# Patient Record
Sex: Female | Born: 1950 | Race: Black or African American | Hispanic: No | Marital: Single | Smoking: Former smoker
Health system: Southern US, Community
[De-identification: ages and names within clinical notes are randomized; demographics above are authoritative.]

## PROBLEM LIST (undated history)

## (undated) DIAGNOSIS — K0889 Other specified disorders of teeth and supporting structures: Secondary | ICD-10-CM

## (undated) DIAGNOSIS — E7800 Pure hypercholesterolemia, unspecified: Secondary | ICD-10-CM

## (undated) DIAGNOSIS — R7303 Prediabetes: Secondary | ICD-10-CM

## (undated) DIAGNOSIS — D649 Anemia, unspecified: Secondary | ICD-10-CM

## (undated) DIAGNOSIS — R222 Localized swelling, mass and lump, trunk: Secondary | ICD-10-CM

## (undated) DIAGNOSIS — M109 Gout, unspecified: Secondary | ICD-10-CM

## (undated) DIAGNOSIS — I10 Essential (primary) hypertension: Secondary | ICD-10-CM

## (undated) DIAGNOSIS — Z972 Presence of dental prosthetic device (complete) (partial): Secondary | ICD-10-CM

## (undated) HISTORY — DX: Prediabetes: R73.03

## (undated) HISTORY — PX: COLONOSCOPY: SHX174

## (undated) HISTORY — DX: Essential (primary) hypertension: I10

## (undated) HISTORY — DX: Localized swelling, mass and lump, trunk: R22.2

## (undated) HISTORY — PX: BREAST BIOPSY: SHX20

## (undated) HISTORY — PX: TUBAL LIGATION: SHX77

---

## 2004-02-19 ENCOUNTER — Ambulatory Visit: Payer: Self-pay | Admitting: Unknown Physician Specialty

## 2004-02-23 ENCOUNTER — Emergency Department: Payer: Self-pay | Admitting: Emergency Medicine

## 2006-01-18 ENCOUNTER — Ambulatory Visit: Payer: Self-pay | Admitting: Unknown Physician Specialty

## 2006-03-28 ENCOUNTER — Ambulatory Visit: Payer: Self-pay | Admitting: Gastroenterology

## 2015-04-14 ENCOUNTER — Other Ambulatory Visit: Payer: Self-pay | Admitting: Family Medicine

## 2015-04-14 DIAGNOSIS — Z1231 Encounter for screening mammogram for malignant neoplasm of breast: Secondary | ICD-10-CM

## 2016-08-25 ENCOUNTER — Other Ambulatory Visit: Payer: Self-pay | Admitting: Family Medicine

## 2016-08-25 DIAGNOSIS — Z1239 Encounter for other screening for malignant neoplasm of breast: Secondary | ICD-10-CM

## 2017-07-31 ENCOUNTER — Other Ambulatory Visit: Payer: Self-pay | Admitting: Family Medicine

## 2017-07-31 DIAGNOSIS — Z13820 Encounter for screening for osteoporosis: Secondary | ICD-10-CM

## 2017-07-31 DIAGNOSIS — Z1231 Encounter for screening mammogram for malignant neoplasm of breast: Secondary | ICD-10-CM

## 2017-09-12 ENCOUNTER — Ambulatory Visit
Admission: RE | Admit: 2017-09-12 | Discharge: 2017-09-12 | Disposition: A | Discharge status: Home / Self Care | Payer: Medicare Other | Source: Ambulatory Visit | Attending: Family Medicine | Admitting: Family Medicine

## 2017-09-12 ENCOUNTER — Encounter (INDEPENDENT_AMBULATORY_CARE_PROVIDER_SITE_OTHER): Payer: Self-pay

## 2017-09-12 DIAGNOSIS — R928 Other abnormal and inconclusive findings on diagnostic imaging of breast: Secondary | ICD-10-CM | POA: Diagnosis not present

## 2017-09-12 DIAGNOSIS — Z1231 Encounter for screening mammogram for malignant neoplasm of breast: Secondary | ICD-10-CM | POA: Diagnosis present

## 2017-09-22 ENCOUNTER — Other Ambulatory Visit: Payer: Self-pay | Admitting: Family Medicine

## 2017-09-22 DIAGNOSIS — R928 Other abnormal and inconclusive findings on diagnostic imaging of breast: Secondary | ICD-10-CM

## 2017-10-03 ENCOUNTER — Ambulatory Visit
Admission: RE | Admit: 2017-10-03 | Discharge: 2017-10-03 | Disposition: A | Discharge status: Home / Self Care | Payer: Medicare Other | Source: Ambulatory Visit | Attending: Family Medicine | Admitting: Family Medicine

## 2017-10-03 DIAGNOSIS — R928 Other abnormal and inconclusive findings on diagnostic imaging of breast: Secondary | ICD-10-CM

## 2017-10-10 ENCOUNTER — Other Ambulatory Visit: Payer: Self-pay | Admitting: Family Medicine

## 2017-10-10 DIAGNOSIS — N6310 Unspecified lump in the right breast, unspecified quadrant: Secondary | ICD-10-CM

## 2017-10-10 DIAGNOSIS — R928 Other abnormal and inconclusive findings on diagnostic imaging of breast: Secondary | ICD-10-CM

## 2017-10-16 ENCOUNTER — Ambulatory Visit
Admission: RE | Admit: 2017-10-16 | Discharge: 2017-10-16 | Disposition: A | Discharge status: Home / Self Care | Payer: Medicare Other | Source: Ambulatory Visit | Attending: Family Medicine | Admitting: Family Medicine

## 2017-10-16 DIAGNOSIS — R928 Other abnormal and inconclusive findings on diagnostic imaging of breast: Secondary | ICD-10-CM | POA: Diagnosis not present

## 2017-10-16 DIAGNOSIS — N6310 Unspecified lump in the right breast, unspecified quadrant: Secondary | ICD-10-CM | POA: Insufficient documentation

## 2017-10-16 HISTORY — PX: BREAST BIOPSY: SHX20

## 2017-10-17 LAB — SURGICAL PATHOLOGY

## 2017-11-07 ENCOUNTER — Encounter: Payer: Self-pay | Admitting: Surgery

## 2017-11-07 ENCOUNTER — Ambulatory Visit (INDEPENDENT_AMBULATORY_CARE_PROVIDER_SITE_OTHER): Payer: Medicare Other | Admitting: Surgery

## 2017-11-07 VITALS — BP 164/93 | HR 86 | Temp 98.3°F | Ht 64.0 in | Wt 217.0 lb

## 2017-11-07 DIAGNOSIS — R928 Other abnormal and inconclusive findings on diagnostic imaging of breast: Secondary | ICD-10-CM | POA: Diagnosis not present

## 2017-11-07 NOTE — Patient Instructions (Signed)
[facility] will be contacting you to schedule your MRI. They will also give you directions on how to get there. Please go and have your MRI done and then we will see you back to discuss results.  Please give [place] a call in case you have any questions or concerns.

## 2017-11-07 NOTE — Progress Notes (Signed)
Alicia Bridges is an 67 y.o. female.   Chief Complaint: Right breast intraductal papilloma Consult requested by cancer Center MD HPI: This a patient had a routine mammogram.  She did not feel a mass.  She does not do regular self exams.  This was found on a routine mammogram screening but she has not had a mammogram every year. 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 No family history of breast cancer. Past Surgical History:  Procedure Laterality Date  . BREAST BIOPSY Right 10/16/2017   path pending    Family History  Problem Relation Age of Onset  . Breast cancer Neg Hx    Social History:  has no tobacco, alcohol, and drug history on file.  Allergies: Allergies not on file   (Not in a hospital admission)   Review of Systems:   Review of Systems  Constitutional: Negative.   HENT: Negative.   Eyes: Negative.   Respiratory: Negative.   Cardiovascular: Negative.   Gastrointestinal: Negative.   Genitourinary: Negative.   Musculoskeletal: Negative.   Skin: Negative.   Neurological: Negative.   Endo/Heme/Allergies: Negative.   Psychiatric/Behavioral: Negative.     Physical Exam:  Physical Exam  Constitutional: She is oriented to person, place, and time. She appears well-developed and well-nourished. No distress.  HENT:  Head: Normocephalic and atraumatic.  Eyes: Pupils are equal, round, and reactive to light. EOM are normal. Right eye exhibits no discharge. Left eye exhibits no discharge. No scleral icterus.  Neck: Normal range of motion. Neck supple.  Cardiovascular: Normal rate and regular rhythm.  Pulmonary/Chest: Effort normal and breath sounds normal. No respiratory distress.  Abdominal: Soft. She exhibits no distension.  Musculoskeletal: Normal range of motion. She exhibits no edema or deformity.  Neurological: She is alert and oriented  to person, place, and time.  Skin: Skin is warm and dry. She is not diaphoretic. No erythema. No pallor.  Vitals reviewed. Breast exam: No palpable mass in either breast no axillary adenopathy biopsy site on the right breast is healing well with minimal ecchymosis in the upper inner quadrant.  No subareolar mass palpable  There were no vitals taken for this visit.    No results found for this or any previous visit (from the past 48 hour(s)). No results found.   Assessment/Plan Mammogram and ultrasound are personally reviewed.  Lip is in place.  I agree with the need for an MRI followed by excision.  We will order the MRI and see her back afterwards and discuss excisional biopsy of this likely benign intraductal papilloma.  Florene Glen, MD, FACS

## 2017-11-17 ENCOUNTER — Ambulatory Visit
Admission: RE | Admit: 2017-11-17 | Discharge: 2017-11-17 | Disposition: A | Discharge status: Home / Self Care | Payer: Medicare Other | Source: Ambulatory Visit | Attending: Surgery | Admitting: Surgery

## 2017-11-17 DIAGNOSIS — R928 Other abnormal and inconclusive findings on diagnostic imaging of breast: Secondary | ICD-10-CM

## 2017-11-17 MED ORDER — GADOBENATE DIMEGLUMINE 529 MG/ML IV SOLN
20.0000 mL | Freq: Once | INTRAVENOUS | Status: AC | PRN
  Administered 2017-11-17: 20 mL via INTRAVENOUS

## 2017-11-29 ENCOUNTER — Other Ambulatory Visit: Payer: Self-pay

## 2017-12-06 ENCOUNTER — Other Ambulatory Visit: Payer: Self-pay

## 2017-12-06 ENCOUNTER — Telehealth: Payer: Self-pay

## 2017-12-06 ENCOUNTER — Other Ambulatory Visit: Payer: Self-pay | Admitting: Surgery

## 2017-12-06 DIAGNOSIS — R928 Other abnormal and inconclusive findings on diagnostic imaging of breast: Secondary | ICD-10-CM

## 2017-12-06 NOTE — Telephone Encounter (Signed)
Called patient to let her know that Dr. Burt Knack requested for her to have a MRI Guided Biopsy. Therefore, this was ordered for patient and the information was left on her home and cell phone, plus mailed.

## 2017-12-13 ENCOUNTER — Ambulatory Visit
Admission: RE | Admit: 2017-12-13 | Discharge: 2017-12-13 | Disposition: A | Discharge status: Home / Self Care | Payer: Medicare Other | Source: Ambulatory Visit | Attending: Surgery | Admitting: Surgery

## 2017-12-13 ENCOUNTER — Other Ambulatory Visit: Payer: Self-pay | Admitting: Surgery

## 2017-12-13 DIAGNOSIS — R928 Other abnormal and inconclusive findings on diagnostic imaging of breast: Secondary | ICD-10-CM

## 2017-12-13 MED ORDER — GADOBENATE DIMEGLUMINE 529 MG/ML IV SOLN
20.0000 mL | Freq: Once | INTRAVENOUS | Status: AC | PRN
  Administered 2017-12-13: 20 mL via INTRAVENOUS

## 2017-12-21 ENCOUNTER — Encounter: Payer: Self-pay | Admitting: Surgery

## 2017-12-21 ENCOUNTER — Ambulatory Visit (INDEPENDENT_AMBULATORY_CARE_PROVIDER_SITE_OTHER): Payer: Medicare Other | Admitting: Surgery

## 2017-12-21 VITALS — BP 161/95 | HR 75 | Temp 98.2°F | Ht 64.0 in | Wt 211.0 lb

## 2017-12-21 DIAGNOSIS — R928 Other abnormal and inconclusive findings on diagnostic imaging of breast: Secondary | ICD-10-CM | POA: Diagnosis not present

## 2017-12-21 NOTE — Progress Notes (Signed)
Outpatient Surgical Follow Up  12/21/2017  Alicia Bridges is an 67 y.o. female.   CC: Intraductal papilloma of the right breast  HPI: This patient with an abnormal mammogram leading to an intraductal papilloma finding on core needle biopsy of the right breast.  She also had a recent MRI with biopsies which were benign.  No complaints at this time.  Not noticing any new masses  Past Medical History:  Diagnosis Date  . Hypertension   . Pre-diabetes     Past Surgical History:  Procedure Laterality Date  . BREAST BIOPSY Right 10/16/2017   path pending  . TUBAL LIGATION      Family History  Problem Relation Age of Onset  . Heart failure Mother   . Stroke Father   . Breast cancer Neg Hx     Social History:  reports that she quit smoking about 19 years ago. She has a 3.00 pack-year smoking history. She has never used smokeless tobacco. She reports that she does not drink alcohol or use drugs.  Allergies: No Known Allergies  Medications reviewed.   Review of Systems:   Review of Systems  Constitutional: Negative.   HENT: Negative.   Eyes: Negative.   Respiratory: Negative.   Cardiovascular: Negative.   Gastrointestinal: Negative.   Genitourinary: Negative.   Musculoskeletal: Negative.   Skin: Negative.   Neurological: Negative.   Endo/Heme/Allergies: Negative.   Psychiatric/Behavioral: Negative.      Physical Exam:  There were no vitals taken for this visit.  Physical Exam  Constitutional: She is oriented to person, place, and time. She appears well-developed and well-nourished.  HENT:  Head: Normocephalic and atraumatic.  Eyes: Pupils are equal, round, and reactive to light. EOM are normal.  Neck: Normal range of motion. Neck supple.  Cardiovascular: Normal rate and regular rhythm.  Pulmonary/Chest: Effort normal and breath sounds normal. No respiratory distress.  Musculoskeletal: Normal range of motion. She exhibits no edema or deformity.   Neurological: She is alert and oriented to person, place, and time.  Skin: Skin is warm and dry.  Vitals reviewed.  Breast exam demonstrates a Band-Aid in the right lateral quadrant no mass in either breast.   No results found for this or any previous visit (from the past 48 hour(s)). No results found.  Assessment/Plan:  Pathology and mammograms are reviewed.  As is the MRI.  Recommend a right-sided needle localization breast biopsy for the original intraductal papilloma.  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  The rationale for this was discussed the options of observation with serial mammograms were reviewed with her.  She is retired and wants to get a new job and wants to put this behind her.  The risks of bleeding infection cosmetic deformity seroma and open wound were reviewed with her as well as the risk of missed lesion she understood and agreed to proceed  Florene Glen, MD, FACS

## 2017-12-21 NOTE — Patient Instructions (Addendum)
We will call you with your surgery date.   Please see the blue pre-care sheet for surgery sheet.  Please call office if you have questions or concerns.

## 2017-12-25 ENCOUNTER — Other Ambulatory Visit: Payer: Self-pay | Admitting: Surgery

## 2017-12-25 ENCOUNTER — Telehealth: Payer: Self-pay | Admitting: Surgery

## 2017-12-25 DIAGNOSIS — D241 Benign neoplasm of right breast: Secondary | ICD-10-CM

## 2017-12-25 NOTE — Telephone Encounter (Signed)
Pt advised of pre op date/time and sx date. Sx: 12/27/17 with Dr Charm Barges breast biopsy with NL.  Pre op: 12/26/17 between 9-1:00pm--phone interview.   Patient made aware to arrive at [facility] at 8:20am the day of surgery. Patient understands all directions.

## 2017-12-26 ENCOUNTER — Other Ambulatory Visit: Payer: Self-pay

## 2017-12-26 ENCOUNTER — Encounter
Admission: RE | Admit: 2017-12-26 | Discharge: 2017-12-26 | Disposition: A | Discharge status: Home / Self Care | Payer: Medicare Other | Source: Ambulatory Visit | Attending: Surgery | Admitting: Surgery

## 2017-12-26 DIAGNOSIS — D241 Benign neoplasm of right breast: Secondary | ICD-10-CM | POA: Diagnosis not present

## 2017-12-26 DIAGNOSIS — Z87891 Personal history of nicotine dependence: Secondary | ICD-10-CM | POA: Diagnosis not present

## 2017-12-26 DIAGNOSIS — I10 Essential (primary) hypertension: Secondary | ICD-10-CM | POA: Diagnosis not present

## 2017-12-26 DIAGNOSIS — Z79899 Other long term (current) drug therapy: Secondary | ICD-10-CM | POA: Diagnosis not present

## 2017-12-26 DIAGNOSIS — R7303 Prediabetes: Secondary | ICD-10-CM | POA: Diagnosis not present

## 2017-12-26 HISTORY — DX: Anemia, unspecified: D64.9

## 2017-12-26 LAB — BASIC METABOLIC PANEL
Anion gap: 9 (ref 5–15)
BUN: 18 mg/dL (ref 8–23)
CHLORIDE: 110 mmol/L (ref 98–111)
CO2: 24 mmol/L (ref 22–32)
Calcium: 9.3 mg/dL (ref 8.9–10.3)
Creatinine, Ser: 1.16 mg/dL — ABNORMAL HIGH (ref 0.44–1.00)
GFR calc Af Amer: 54 mL/min — ABNORMAL LOW (ref >60)
GFR calc non Af Amer: 47 mL/min — ABNORMAL LOW (ref >60)
GLUCOSE: 110 mg/dL — AB (ref 70–99)
Potassium: 3.9 mmol/L (ref 3.5–5.1)
Sodium: 143 mmol/L (ref 135–145)

## 2017-12-26 LAB — CBC WITH DIFFERENTIAL/PLATELET
Basophils Absolute: 0 10*3/uL (ref 0–0.1)
Basophils Relative: 0 %
Eosinophils Absolute: 0 10*3/uL (ref 0–0.7)
Eosinophils Relative: 1 %
HCT: 32.7 % — ABNORMAL LOW (ref 35.0–47.0)
Hemoglobin: 10.8 g/dL — ABNORMAL LOW (ref 12.0–16.0)
LYMPHS ABS: 1.9 10*3/uL (ref 1.0–3.6)
LYMPHS PCT: 39 %
MCH: 29.9 pg (ref 26.0–34.0)
MCHC: 33.2 g/dL (ref 32.0–36.0)
MCV: 90.1 fL (ref 80.0–100.0)
MONO ABS: 0.4 10*3/uL (ref 0.2–0.9)
Monocytes Relative: 8 %
Neutro Abs: 2.6 10*3/uL (ref 1.4–6.5)
Neutrophils Relative %: 52 %
PLATELETS: 237 10*3/uL (ref 150–440)
RBC: 3.63 MIL/uL — AB (ref 3.80–5.20)
RDW: 15.3 % — ABNORMAL HIGH (ref 11.5–14.5)
WBC: 4.9 10*3/uL (ref 3.6–11.0)

## 2017-12-26 NOTE — Patient Instructions (Signed)
Your procedure is scheduled on: 12-27-17 [facility] Report to [facility] @ 8:20 AM  Remember: Instructions that are not followed completely may result in serious medical risk, up to and including death, or upon the discretion of your surgeon and anesthesiologist your surgery may need to be rescheduled.    _x___ 1. Do not eat food after midnight the night before your procedure. You may drink clear liquids up to 2 hours before you are scheduled to arrive at the hospital for your procedure.  Do not drink clear liquids within 2 hours of your scheduled arrival to the hospital.  Clear liquids include  --Water or Apple juice without pulp  --Clear carbohydrate beverage such as ClearFast or Gatorade  --Black Coffee or Clear Tea (No milk, no creamers, do not add anything to the coffee or Tea   ____Ensure clear carbohydrate drink on the way to the hospital for bariatric patients  ____Ensure clear carbohydrate drink 3 hours before surgery for Dr Dwyane Luo patients if physician instructed.     __x__ 2. No Alcohol for 24 hours before or after surgery.   __x__3. No Smoking or e-cigarettes for 24 prior to surgery.  Do not use any chewable tobacco products for at least 6 hour prior to surgery   ____  4. Bring all medications with you on the day of surgery if instructed.    __x__ 5. Notify your doctor if there is any change in your medical condition     (cold, fever, infections).    x___6. On the morning of surgery brush your teeth with toothpaste and water.  You may rinse your mouth with mouth wash if you wish.  Do not swallow any toothpaste or mouthwash.   Do not wear jewelry, make-up, hairpins, clips or nail polish.  Do not wear lotions, powders, or perfumes. You may wear deodorant.  Do not shave 48 hours prior to surgery. Men may shave face and neck.  Do not bring valuables to the hospital.    [facility] is not responsible for any belongings or valuables.               Contacts,  dentures or bridgework may not be worn into surgery.  Leave your suitcase in the car. After surgery it may be brought to your room.  For patients admitted to the hospital, discharge time is determined by your treatment team.  _  Patients discharged the day of surgery will not be allowed to drive home.  You will need someone to drive you home and stay with you the night of your procedure.    Please read over the following fact sheets that you were given:   [facility] Preparing for Surgery  ____ Take anti-hypertensive listed below, cardiac, seizure, asthma, anti-reflux and psychiatric medicines. These include:  1. NONE  2.  3.  4.  5.  6.  ____Fleets enema or Magnesium Citrate as directed.   _x___ Use CHG Soap or sage wipes as directed on instruction sheet   ____ Use inhalers on the day of surgery and bring to hospital day of surgery  ____ Stop Metformin and Janumet 2 days prior to surgery.    ____ Take 1/2 of usual insulin dose the night before surgery and none on the morning surgery.   ____ Follow recommendations from Cardiologist, Pulmonologist or PCP regarding stopping Aspirin, Coumadin, Plavix ,Eliquis, Effient, or Pradaxa, and Pletal.  X____Stop Anti-inflammatories such as Advil, Aleve, Ibuprofen, Motrin, Naproxen, Naprosyn, Goodies powders or aspirin products. OK  to take Tylenol    ____ Stop supplements until after surgery.    ____ Bring C-Pap to the hospital.

## 2017-12-27 ENCOUNTER — Ambulatory Visit: Payer: Medicare Other | Admitting: Anesthesiology

## 2017-12-27 ENCOUNTER — Ambulatory Visit
Admission: RE | Admit: 2017-12-27 | Discharge: 2017-12-27 | Disposition: A | Discharge status: Home / Self Care | Payer: Medicare Other | Source: Ambulatory Visit | Attending: Surgery | Admitting: Surgery

## 2017-12-27 ENCOUNTER — Other Ambulatory Visit: Payer: Self-pay

## 2017-12-27 ENCOUNTER — Encounter
Admission: RE | Disposition: A | Discharge status: Home / Self Care | Payer: Self-pay | Source: Ambulatory Visit | Attending: Surgery

## 2017-12-27 ENCOUNTER — Encounter: Payer: Self-pay | Admitting: *Deleted

## 2017-12-27 DIAGNOSIS — I10 Essential (primary) hypertension: Secondary | ICD-10-CM | POA: Insufficient documentation

## 2017-12-27 DIAGNOSIS — Z87891 Personal history of nicotine dependence: Secondary | ICD-10-CM | POA: Insufficient documentation

## 2017-12-27 DIAGNOSIS — R928 Other abnormal and inconclusive findings on diagnostic imaging of breast: Secondary | ICD-10-CM | POA: Diagnosis not present

## 2017-12-27 DIAGNOSIS — D241 Benign neoplasm of right breast: Secondary | ICD-10-CM | POA: Diagnosis not present

## 2017-12-27 DIAGNOSIS — Z79899 Other long term (current) drug therapy: Secondary | ICD-10-CM | POA: Insufficient documentation

## 2017-12-27 DIAGNOSIS — R7303 Prediabetes: Secondary | ICD-10-CM | POA: Insufficient documentation

## 2017-12-27 HISTORY — PX: BREAST BIOPSY: SHX20

## 2017-12-27 HISTORY — PX: BREAST EXCISIONAL BIOPSY: SUR124

## 2017-12-27 SURGERY — BREAST BIOPSY WITH NEEDLE LOCALIZATION
Anesthesia: General | Laterality: Right | Wound class: Clean

## 2017-12-27 MED ORDER — FENTANYL CITRATE (PF) 100 MCG/2ML IJ SOLN
INTRAMUSCULAR | Status: AC
  Filled 2017-12-27: qty 2

## 2017-12-27 MED ORDER — FENTANYL CITRATE (PF) 100 MCG/2ML IJ SOLN
INTRAMUSCULAR | Status: DC | PRN
  Administered 2017-12-27 (×2): 50 ug via INTRAVENOUS
  Administered 2017-12-27 (×2): 25 ug via INTRAVENOUS

## 2017-12-27 MED ORDER — MEPERIDINE HCL 50 MG/ML IJ SOLN: 6.2500 mg | INTRAMUSCULAR | Status: DC | PRN

## 2017-12-27 MED ORDER — OXYCODONE HCL 5 MG PO TABS
ORAL_TABLET | ORAL | Status: AC
  Administered 2017-12-27: 5 mg
  Filled 2017-12-27: qty 1

## 2017-12-27 MED ORDER — LACTATED RINGERS IV SOLN
INTRAVENOUS | Status: DC
  Administered 2017-12-27: 10:00:00 via INTRAVENOUS

## 2017-12-27 MED ORDER — BUPIVACAINE-EPINEPHRINE (PF) 0.25% -1:200000 IJ SOLN
INTRAMUSCULAR | Status: AC
  Filled 2017-12-27: qty 30

## 2017-12-27 MED ORDER — BUPIVACAINE-EPINEPHRINE 0.5% -1:200000 IJ SOLN
INTRAMUSCULAR | Status: DC | PRN
  Administered 2017-12-27: 30 mL

## 2017-12-27 MED ORDER — FENTANYL CITRATE (PF) 100 MCG/2ML IJ SOLN
25.0000 ug | INTRAMUSCULAR | Status: DC | PRN
Start: 2017-12-27 — End: 2017-12-27
  Administered 2017-12-27 (×2): 50 ug via INTRAVENOUS

## 2017-12-27 MED ORDER — FAMOTIDINE 20 MG PO TABS
20.0000 mg | ORAL_TABLET | Freq: Once | ORAL | Status: AC
  Administered 2017-12-27: 20 mg via ORAL

## 2017-12-27 MED ORDER — ACETAMINOPHEN 10 MG/ML IV SOLN
INTRAVENOUS | Status: DC | PRN
  Administered 2017-12-27: 1000 mg via INTRAVENOUS

## 2017-12-27 MED ORDER — HEPARIN SODIUM (PORCINE) 5000 UNIT/ML IJ SOLN
INTRAMUSCULAR | Status: AC
  Administered 2017-12-27: 5000 [IU] via SUBCUTANEOUS
  Filled 2017-12-27: qty 1

## 2017-12-27 MED ORDER — PROMETHAZINE HCL 25 MG/ML IJ SOLN: 6.2500 mg | INTRAMUSCULAR | Status: DC | PRN

## 2017-12-27 MED ORDER — PHENYLEPHRINE HCL 10 MG/ML IJ SOLN
INTRAMUSCULAR | Status: DC | PRN
  Administered 2017-12-27: 50 ug via INTRAVENOUS

## 2017-12-27 MED ORDER — PROPOFOL 10 MG/ML IV BOLUS
INTRAVENOUS | Status: DC | PRN
Start: 2017-12-27 — End: 2017-12-27
  Administered 2017-12-27: 160 mg via INTRAVENOUS

## 2017-12-27 MED ORDER — CHLORHEXIDINE GLUCONATE CLOTH 2 % EX PADS: 6.0000 | MEDICATED_PAD | Freq: Once | CUTANEOUS | Status: DC

## 2017-12-27 MED ORDER — OXYCODONE HCL 5 MG PO TABS: 5.0000 mg | ORAL_TABLET | Freq: Once | ORAL | Status: DC | PRN

## 2017-12-27 MED ORDER — DEXAMETHASONE SODIUM PHOSPHATE 10 MG/ML IJ SOLN
INTRAMUSCULAR | Status: DC | PRN
  Administered 2017-12-27: 8 mg via INTRAVENOUS

## 2017-12-27 MED ORDER — HYDROCODONE-ACETAMINOPHEN 5-300 MG PO TABS: 1.0000 | ORAL_TABLET | ORAL | 0 refills | Status: DC | PRN

## 2017-12-27 MED ORDER — FAMOTIDINE 20 MG PO TABS
ORAL_TABLET | ORAL | Status: AC
  Filled 2017-12-27: qty 1

## 2017-12-27 MED ORDER — ONDANSETRON HCL 4 MG/2ML IJ SOLN
INTRAMUSCULAR | Status: AC
  Filled 2017-12-27: qty 2

## 2017-12-27 MED ORDER — PROPOFOL 10 MG/ML IV BOLUS
INTRAVENOUS | Status: AC
  Filled 2017-12-27: qty 20

## 2017-12-27 MED ORDER — LIDOCAINE HCL (CARDIAC) PF 100 MG/5ML IV SOSY
PREFILLED_SYRINGE | INTRAVENOUS | Status: DC | PRN
  Administered 2017-12-27: 100 mg via INTRAVENOUS

## 2017-12-27 MED ORDER — OXYCODONE HCL 5 MG/5ML PO SOLN: 5.0000 mg | Freq: Once | ORAL | Status: DC | PRN

## 2017-12-27 MED ORDER — ACETAMINOPHEN 10 MG/ML IV SOLN
INTRAVENOUS | Status: AC
  Filled 2017-12-27: qty 100

## 2017-12-27 MED ORDER — METHYLENE BLUE 0.5 % INJ SOLN
INTRAVENOUS | Status: AC
  Filled 2017-12-27: qty 10

## 2017-12-27 MED ORDER — BUPIVACAINE-EPINEPHRINE (PF) 0.5% -1:200000 IJ SOLN
INTRAMUSCULAR | Status: AC
  Filled 2017-12-27: qty 30

## 2017-12-27 MED ORDER — HEPARIN SODIUM (PORCINE) 5000 UNIT/ML IJ SOLN
5000.0000 [IU] | Freq: Once | INTRAMUSCULAR | Status: AC
  Administered 2017-12-27: 5000 [IU] via SUBCUTANEOUS

## 2017-12-27 MED ORDER — LIDOCAINE HCL (PF) 2 % IJ SOLN
INTRAMUSCULAR | Status: AC
  Filled 2017-12-27: qty 10

## 2017-12-27 MED ORDER — ONDANSETRON HCL 4 MG/2ML IJ SOLN
INTRAMUSCULAR | Status: DC | PRN
  Administered 2017-12-27: 4 mg via INTRAVENOUS

## 2017-12-27 SURGICAL SUPPLY — 35 items
BLADE SURG 15 STRL LF DISP TIS (BLADE) ×1 IMPLANT
BLADE SURG 15 STRL SS (BLADE) ×2
CANISTER SUCT 1200ML W/VALVE (MISCELLANEOUS) ×3 IMPLANT
CHLORAPREP W/TINT 26ML (MISCELLANEOUS) ×3 IMPLANT
CNTNR SPEC 2.5X3XGRAD LEK (MISCELLANEOUS) ×2
CONT SPEC 4OZ STER OR WHT (MISCELLANEOUS) ×4
CONTAINER SPEC 2.5X3XGRAD LEK (MISCELLANEOUS) ×2 IMPLANT
COVER PROBE FLX POLY STRL (MISCELLANEOUS) ×3 IMPLANT
DEVICE DUBIN SPECIMEN MAMMOGRA (MISCELLANEOUS) ×3 IMPLANT
DRAPE LAPAROTOMY 100X77 ABD (DRAPES) ×3 IMPLANT
DRSG GAUZE FLUFF 36X18 (GAUZE/BANDAGES/DRESSINGS) ×3 IMPLANT
ELECT CAUTERY BLADE 6.4 (BLADE) ×3 IMPLANT
ELECT REM PT RETURN 9FT ADLT (ELECTROSURGICAL) ×3
ELECTRODE REM PT RTRN 9FT ADLT (ELECTROSURGICAL) ×1 IMPLANT
GLOVE SURG SYN 7.0 (GLOVE) ×3 IMPLANT
GLOVE SURG SYN 7.5  E (GLOVE) ×2
GLOVE SURG SYN 7.5 E (GLOVE) ×1 IMPLANT
GOWN STRL REUS W/ TWL LRG LVL3 (GOWN DISPOSABLE) ×1 IMPLANT
GOWN STRL REUS W/TWL LRG LVL3 (GOWN DISPOSABLE) ×2
KIT TURNOVER KIT A (KITS) ×3 IMPLANT
LABEL OR SOLS (LABEL) ×3 IMPLANT
NEEDLE FILTER BLUNT 18X 1/2SAF (NEEDLE) ×2
NEEDLE FILTER BLUNT 18X1 1/2 (NEEDLE) ×1 IMPLANT
NEEDLE HYPO 22GX1.5 SAFETY (NEEDLE) ×3 IMPLANT
NEEDLE HYPO 25X1 1.5 SAFETY (NEEDLE) ×3 IMPLANT
PACK BASIN MINOR ARMC (MISCELLANEOUS) ×3 IMPLANT
SUT MNCRL 4-0 (SUTURE) ×4
SUT MNCRL 4-0 27XMFL (SUTURE) ×2
SUT SILK 3 0 SH 30 (SUTURE) ×3 IMPLANT
SUT VIC AB 3-0 SH 27 (SUTURE) ×4
SUT VIC AB 3-0 SH 27X BRD (SUTURE) ×2 IMPLANT
SUTURE MNCRL 4-0 27XMF (SUTURE) ×2 IMPLANT
SYR 10ML LL (SYRINGE) ×6 IMPLANT
SYR BULB IRRIG 60ML STRL (SYRINGE) ×3 IMPLANT
WATER STERILE IRR 1000ML POUR (IV SOLUTION) ×3 IMPLANT

## 2017-12-27 NOTE — Op Note (Signed)
12/27/2017  1:16 PM  PATIENT:  Alicia Bridges  67 y.o. female  PRE-OPERATIVE DIAGNOSIS: Right mammographic abnormality  POST-OPERATIVE DIAGNOSIS: Same  PROCEDURE: Right needle localization breast biopsy  SURGEON:  Florene Glen MD, FACS  ANESTHESIA:   General with LMA  Indications: This a patient with prior biopsy showing intraductal papilloma of the right breast in the retroareolar area.  She is also had benign MRI biopsies utilizing core needle technique as well.  She is here for excision of the area in the retroareolar space previously biopsied as fragments of intraductal papilloma.  The rationale for this was discussed the options of observation reviewed and the risks of bleeding infection recurrence cosmetic deformity hematoma seroma were all reviewed with her she understood and agreed to proceed   Details of Procedure: Patient was induced to general anesthesia prepped and draped in a sterile fashion a surgical pause was held.  The previously placed needle localization wire was found in the entry point at the periareolar area.  Local anesthetic was infiltrated in the skin is obtains tissues and then an incision was made in the periareolar area and dissection down to the end of the wire was performed specimen was elevated and sent off for examination.  Radiologist called back and I spoke to her personally.  The needle localization wire was in place and intact and the targeted core needle biopsy marker was in the specimen is well.  Additional Marcaine was placed for a total of 30 cc and the wound was checked multiple times for hemostasis was with electrocautery.  Once assuring that hemostasis was adequate the wound was closed with deep sutures of 3-0 Vicryl followed by 4-0 subcuticular Monocryl Steri-Strips Mastisol and sterile dressings were placed  Patient tolerated the procedure well there were no complications she was taken to recovery room in stable condition to be discharged  care of family follow-up in 10 days.  Florene Glen, MD FACS

## 2017-12-27 NOTE — Anesthesia Procedure Notes (Signed)
Procedure Name: LMA Insertion Performed by: Lance Muss, CRNA Pre-anesthesia Checklist: Patient identified, Patient being monitored, Timeout performed, Emergency Drugs available and Suction available Patient Re-evaluated:Patient Re-evaluated prior to induction Oxygen Delivery Method: Circle system utilized Preoxygenation: Pre-oxygenation with 100% oxygen Induction Type: IV induction Ventilation: Mask ventilation without difficulty LMA: LMA inserted LMA Size: 4.5 Tube type: Oral Number of attempts: 2 Placement Confirmation: positive ETCO2 and breath sounds checked- equal and bilateral Tube secured with: Tape Dental Injury: Teeth and Oropharynx as per pre-operative assessment  Comments: Attempted first with LMA #3.5, not able to get seal. Removed and placed #4.5 LMA. +BBS, +ETCO2.

## 2017-12-27 NOTE — Progress Notes (Signed)
Preoperative Review   Patient is met in the preoperative holding area. The history is reviewed in the chart and with the patient. I personally reviewed the options and rationale as well as the risks of this procedure that have been previously discussed with the patient. All questions asked by the patient and/or family were answered to their satisfaction. Patient marked.  Patient agrees to proceed with this procedure at this time.  Florene Glen M.D. FACS

## 2017-12-27 NOTE — Anesthesia Postprocedure Evaluation (Signed)
Anesthesia Post Note  Patient: Alicia Bridges  Procedure(s) Performed: BREAST BIOPSY WITH NEEDLE LOCALIZATION (Right )  Patient location during evaluation: PACU Anesthesia Type: General Level of consciousness: awake and alert and oriented Pain management: pain level controlled Vital Signs Assessment: post-procedure vital signs reviewed and stable Respiratory status: spontaneous breathing, nonlabored ventilation and respiratory function stable Cardiovascular status: blood pressure returned to baseline and stable Postop Assessment: no signs of nausea or vomiting Anesthetic complications: no     Last Vitals:  Vitals:   12/27/17 1304 12/27/17 1318  BP:  (!) 142/83  Pulse: 81 79  Resp: 17 11  Temp:    SpO2: 100% 97%    Last Pain:  Vitals:   12/27/17 1303  TempSrc:   PainSc: 0-No pain                 Bairon Klemann

## 2017-12-27 NOTE — Transfer of Care (Signed)
Immediate Anesthesia Transfer of Care Note  Patient: Alicia Bridges  Procedure(s) Performed: BREAST BIOPSY WITH NEEDLE LOCALIZATION (Right )  Patient Location: PACU  Anesthesia Type:General  Level of Consciousness: awake and responds to stimulation  Airway & Oxygen Therapy: Patient Spontanous Breathing and Patient connected to face mask oxygen  Post-op Assessment: Report given to RN and Post -op Vital signs reviewed and stable  Post vital signs: Reviewed and stable  Last Vitals:  Vitals Value Taken Time  BP 147/84 12/27/2017  1:03 PM  Temp    Pulse 81 12/27/2017  1:04 PM  Resp 17 12/27/2017  1:04 PM  SpO2 100 % 12/27/2017  1:04 PM  Vitals shown include unvalidated device data.  Last Pain:  Vitals:   12/27/17 0952  TempSrc: Temporal  PainSc: 0-No pain         Complications: No apparent anesthesia complications

## 2017-12-27 NOTE — Discharge Instructions (Signed)
AMBULATORY SURGERY  DISCHARGE INSTRUCTIONS   1) The drugs that you were given will stay in your system until tomorrow so for the next 24 hours you should not:  A) Drive an automobile B) Make any legal decisions C) Drink any alcoholic beverage   2) You may resume regular meals tomorrow.  Today it is better to start with liquids and gradually work up to solid foods.  You may eat anything you prefer, but it is better to start with liquids, then soup and crackers, and gradually work up to solid foods.   3) Please notify your doctor immediately if you have any unusual bleeding, trouble breathing, redness and pain at the surgery site, drainage, fever, or pain not relieved by medication.    4) Additional Instructions:        Please contact your physician with any problems or Same Day Surgery at [phone], Monday through Friday 6 am to 4 pm, or [place] at [facility] number at [phone].Remove dressing in 24 hours. May shower in 24 hours. Leave paper strips in place. Resume all home medications. Follow-up with Dr. Burt Knack in 10 days.

## 2017-12-27 NOTE — Anesthesia Preprocedure Evaluation (Signed)
Anesthesia Evaluation  Patient identified by MRN, date of birth, ID band Patient awake    Reviewed: Allergy & Precautions, NPO status , Patient's Chart, lab work & pertinent test results  History of Anesthesia Complications Negative for: history of anesthetic complications  Airway Mallampati: II  TM Distance: >3 FB Neck ROM: Full    Dental  (+) Missing, Poor Dentition,    Pulmonary neg sleep apnea, neg COPD, former smoker,    breath sounds clear to auscultation- rhonchi (-) wheezing      Cardiovascular hypertension, Pt. on medications (-) CAD, (-) Past MI, (-) Cardiac Stents and (-) CABG  Rhythm:Regular Rate:Normal - Systolic murmurs and - Diastolic murmurs    Neuro/Psych negative neurological ROS  negative psych ROS   GI/Hepatic negative GI ROS, Neg liver ROS,   Endo/Other  Diabetes: prediabetes.  Renal/GU negative Renal ROS     Musculoskeletal negative musculoskeletal ROS (+)   Abdominal (+) + obese,   Peds  Hematology  (+) anemia ,   Anesthesia Other Findings Past Medical History: No date: Anemia No date: Hypertension No date: Pre-diabetes   Reproductive/Obstetrics                             Anesthesia Physical Anesthesia Plan  ASA: II  Anesthesia Plan: General   Post-op Pain Management:    Induction: Intravenous  PONV Risk Score and Plan: 2 and Ondansetron, Dexamethasone and Midazolam  Airway Management Planned: LMA  Additional Equipment:   Intra-op Plan:   Post-operative Plan:   Informed Consent: I have reviewed the patients History and Physical, chart, labs and discussed the procedure including the risks, benefits and alternatives for the proposed anesthesia with the patient or authorized representative who has indicated his/her understanding and acceptance.   Dental advisory given  Plan Discussed with: CRNA and Anesthesiologist  Anesthesia Plan Comments:          Anesthesia Quick Evaluation

## 2017-12-27 NOTE — Anesthesia Post-op Follow-up Note (Signed)
Anesthesia QCDR form completed.        

## 2017-12-28 ENCOUNTER — Encounter: Payer: Self-pay | Admitting: Surgery

## 2017-12-28 LAB — SURGICAL PATHOLOGY

## 2018-01-02 ENCOUNTER — Ambulatory Visit (INDEPENDENT_AMBULATORY_CARE_PROVIDER_SITE_OTHER): Payer: Medicare Other | Admitting: Surgery

## 2018-01-02 ENCOUNTER — Encounter: Payer: Self-pay | Admitting: Surgery

## 2018-01-02 VITALS — BP 162/94 | HR 88 | Temp 97.0°F | Resp 12 | Ht 64.0 in | Wt 206.0 lb

## 2018-01-02 DIAGNOSIS — R928 Other abnormal and inconclusive findings on diagnostic imaging of breast: Secondary | ICD-10-CM

## 2018-01-02 NOTE — Progress Notes (Signed)
Outpatient postop visit  01/02/2018  Alicia Bridges is an 67 y.o. female.    Procedure: Right needle localization breast biopsy  CC: No problems  HPI: Patient status post right needle localization breast biopsy for what was believed to be an later proven by biopsy to be an intraductal papilloma.  The allergy is personally reviewed. Patient has no problems no complaints at this time.  Medications reviewed.    Physical Exam:  Ht 5\' 4"  (1.626 m)   Wt 206 lb (93.4 kg)   BMI 35.36 kg/m     PE: Wound is clean with Steri-Strips in place no erythema no drainage no ecchymosis    Assessment/Plan:  Pathology is reviewed.  Intraductal papilloma present.  Recommend right mammogram in 6 months for follow-up and new baseline.  Discussed pathology with the patient.  Follow-up in 6 months. Florene Glen, MD, FACS

## 2018-01-02 NOTE — Patient Instructions (Addendum)
The patient is aware to call back for any questions or new concerns. Steri strips will gradually fall off The patient has been asked to return to the office in 6 months with a right diagnostic mammogram.

## 2018-02-21 ENCOUNTER — Other Ambulatory Visit: Payer: Self-pay

## 2018-02-21 ENCOUNTER — Encounter: Payer: Self-pay | Admitting: Family Medicine

## 2018-02-21 DIAGNOSIS — Z1211 Encounter for screening for malignant neoplasm of colon: Secondary | ICD-10-CM

## 2018-06-14 ENCOUNTER — Encounter: Payer: Self-pay | Admitting: *Deleted

## 2018-06-14 ENCOUNTER — Encounter: Payer: Self-pay | Admitting: Anesthesiology

## 2018-06-14 ENCOUNTER — Other Ambulatory Visit: Payer: Self-pay

## 2018-06-19 ENCOUNTER — Telehealth: Payer: Self-pay | Admitting: Gastroenterology

## 2018-06-19 NOTE — Discharge Instructions (Signed)
General Anesthesia, Adult, Care After  This sheet gives you information about how to care for yourself after your procedure. Your health care provider may also give you more specific instructions. If you have problems or questions, contact your health care provider.  What can I expect after the procedure?  After the procedure, the following side effects are common:  Pain or discomfort at the IV site.  Nausea.  Vomiting.  Sore throat.  Trouble concentrating.  Feeling cold or chills.  Weak or tired.  Sleepiness and fatigue.  Soreness and body aches. These side effects can affect parts of the body that were not involved in surgery.  Follow these instructions at home:    For at least 24 hours after the procedure:  Have a responsible adult stay with you. It is important to have someone help care for you until you are awake and alert.  Rest as needed.  Do not:  Participate in activities in which you could fall or become injured.  Drive.  Use heavy machinery.  Drink alcohol.  Take sleeping pills or medicines that cause drowsiness.  Make important decisions or sign legal documents.  Take care of children on your own.  Eating and drinking  Follow any instructions from your health care provider about eating or drinking restrictions.  When you feel hungry, start by eating small amounts of foods that are soft and easy to digest (bland), such as toast. Gradually return to your regular diet.  Drink enough fluid to keep your urine pale yellow.  If you vomit, rehydrate by drinking water, juice, or clear broth.  General instructions  If you have sleep apnea, surgery and certain medicines can increase your risk for breathing problems. Follow instructions from your health care provider about wearing your sleep device:  Anytime you are sleeping, including during daytime naps.  While taking prescription pain medicines, sleeping medicines, or medicines that make you drowsy.  Return to your normal activities as told by your health care  provider. Ask your health care provider what activities are safe for you.  Take over-the-counter and prescription medicines only as told by your health care provider.  If you smoke, do not smoke without supervision.  Keep all follow-up visits as told by your health care provider. This is important.  Contact a health care provider if:  You have nausea or vomiting that does not get better with medicine.  You cannot eat or drink without vomiting.  You have pain that does not get better with medicine.  You are unable to pass urine.  You develop a skin rash.  You have a fever.  You have redness around your IV site that gets worse.  Get help right away if:  You have difficulty breathing.  You have chest pain.  You have blood in your urine or stool, or you vomit blood.  Summary  After the procedure, it is common to have a sore throat or nausea. It is also common to feel tired.  Have a responsible adult stay with you for the first 24 hours after general anesthesia. It is important to have someone help care for you until you are awake and alert.  When you feel hungry, start by eating small amounts of foods that are soft and easy to digest (bland), such as toast. Gradually return to your regular diet.  Drink enough fluid to keep your urine pale yellow.  Return to your normal activities as told by your health care provider. Ask your health care   provider what activities are safe for you.  This information is not intended to replace advice given to you by your health care provider. Make sure you discuss any questions you have with your health care provider.  Document Released: 08/08/2000 Document Revised: 12/16/2016 Document Reviewed: 12/16/2016  Elsevier Interactive Patient Education  2019 [employer]

## 2018-06-19 NOTE — Telephone Encounter (Signed)
PT is calling to r/s her procedure 06/21/18 due to having a bad cold

## 2018-06-20 ENCOUNTER — Telehealth: Payer: Self-pay | Admitting: Gastroenterology

## 2018-06-20 NOTE — Telephone Encounter (Signed)
Patient returned Michelle's call to schedule a colonoscopy. She would like 07-02-2018 for the date.

## 2018-06-20 NOTE — Telephone Encounter (Signed)
Patients call has been returned regarding canceling tomorrows colonoscopy with Dr. Allen Norris.  She will call back to reschedule later on today.  Kim at [facility] notified.   No Fee for cancellation due to sickness.  Thanks [employer]

## 2018-06-21 ENCOUNTER — Other Ambulatory Visit: Payer: Self-pay

## 2018-06-21 ENCOUNTER — Ambulatory Visit: Admission: RE | Admit: 2018-06-21 | Payer: Medicare Other | Source: Home / Self Care | Admitting: Gastroenterology

## 2018-06-21 DIAGNOSIS — Z1211 Encounter for screening for malignant neoplasm of colon: Secondary | ICD-10-CM

## 2018-06-21 HISTORY — DX: Presence of dental prosthetic device (complete) (partial): Z97.2

## 2018-06-21 HISTORY — DX: Pure hypercholesterolemia, unspecified: E78.00

## 2018-06-21 SURGERY — COLONOSCOPY WITH PROPOFOL
Anesthesia: Choice

## 2018-06-21 NOTE — Telephone Encounter (Signed)
Returned patients call to let her know 07/02/18 is available and I will put her in for her colonoscopy on that date with Dr. Allen Norris.  Thanks [employer]

## 2018-06-28 NOTE — Discharge Instructions (Signed)
General Anesthesia, Adult, Care After  This sheet gives you information about how to care for yourself after your procedure. Your health care provider may also give you more specific instructions. If you have problems or questions, contact your health care provider.  What can I expect after the procedure?  After the procedure, the following side effects are common:  Pain or discomfort at the IV site.  Nausea.  Vomiting.  Sore throat.  Trouble concentrating.  Feeling cold or chills.  Weak or tired.  Sleepiness and fatigue.  Soreness and body aches. These side effects can affect parts of the body that were not involved in surgery.  Follow these instructions at home:    For at least 24 hours after the procedure:  Have a responsible adult stay with you. It is important to have someone help care for you until you are awake and alert.  Rest as needed.  Do not:  Participate in activities in which you could fall or become injured.  Drive.  Use heavy machinery.  Drink alcohol.  Take sleeping pills or medicines that cause drowsiness.  Make important decisions or sign legal documents.  Take care of children on your own.  Eating and drinking  Follow any instructions from your health care provider about eating or drinking restrictions.  When you feel hungry, start by eating small amounts of foods that are soft and easy to digest (bland), such as toast. Gradually return to your regular diet.  Drink enough fluid to keep your urine pale yellow.  If you vomit, rehydrate by drinking water, juice, or clear broth.  General instructions  If you have sleep apnea, surgery and certain medicines can increase your risk for breathing problems. Follow instructions from your health care provider about wearing your sleep device:  Anytime you are sleeping, including during daytime naps.  While taking prescription pain medicines, sleeping medicines, or medicines that make you drowsy.  Return to your normal activities as told by your health care  provider. Ask your health care provider what activities are safe for you.  Take over-the-counter and prescription medicines only as told by your health care provider.  If you smoke, do not smoke without supervision.  Keep all follow-up visits as told by your health care provider. This is important.  Contact a health care provider if:  You have nausea or vomiting that does not get better with medicine.  You cannot eat or drink without vomiting.  You have pain that does not get better with medicine.  You are unable to pass urine.  You develop a skin rash.  You have a fever.  You have redness around your IV site that gets worse.  Get help right away if:  You have difficulty breathing.  You have chest pain.  You have blood in your urine or stool, or you vomit blood.  Summary  After the procedure, it is common to have a sore throat or nausea. It is also common to feel tired.  Have a responsible adult stay with you for the first 24 hours after general anesthesia. It is important to have someone help care for you until you are awake and alert.  When you feel hungry, start by eating small amounts of foods that are soft and easy to digest (bland), such as toast. Gradually return to your regular diet.  Drink enough fluid to keep your urine pale yellow.  Return to your normal activities as told by your health care provider. Ask your health care   provider what activities are safe for you.  This information is not intended to replace advice given to you by your health care provider. Make sure you discuss any questions you have with your health care provider.  Document Released: 08/08/2000 Document Revised: 12/16/2016 Document Reviewed: 12/16/2016  Elsevier Interactive Patient Education  2019 [employer]

## 2018-07-02 ENCOUNTER — Ambulatory Visit
Admission: RE | Admit: 2018-07-02 | Discharge: 2018-07-02 | Disposition: A | Discharge status: Home / Self Care | Payer: Medicare Other | Attending: Gastroenterology | Admitting: Gastroenterology

## 2018-07-02 ENCOUNTER — Ambulatory Visit: Payer: Medicare Other | Admitting: Anesthesiology

## 2018-07-02 ENCOUNTER — Encounter
Admission: RE | Disposition: A | Discharge status: Home / Self Care | Payer: Self-pay | Source: Home / Self Care | Attending: Gastroenterology

## 2018-07-02 DIAGNOSIS — Z87891 Personal history of nicotine dependence: Secondary | ICD-10-CM | POA: Diagnosis not present

## 2018-07-02 DIAGNOSIS — K5730 Diverticulosis of large intestine without perforation or abscess without bleeding: Secondary | ICD-10-CM | POA: Diagnosis not present

## 2018-07-02 DIAGNOSIS — Z1211 Encounter for screening for malignant neoplasm of colon: Secondary | ICD-10-CM | POA: Diagnosis not present

## 2018-07-02 DIAGNOSIS — E7800 Pure hypercholesterolemia, unspecified: Secondary | ICD-10-CM | POA: Insufficient documentation

## 2018-07-02 DIAGNOSIS — I10 Essential (primary) hypertension: Secondary | ICD-10-CM | POA: Insufficient documentation

## 2018-07-02 DIAGNOSIS — Z79899 Other long term (current) drug therapy: Secondary | ICD-10-CM | POA: Diagnosis not present

## 2018-07-02 DIAGNOSIS — K640 First degree hemorrhoids: Secondary | ICD-10-CM | POA: Insufficient documentation

## 2018-07-02 HISTORY — DX: Other specified disorders of teeth and supporting structures: K08.89

## 2018-07-02 HISTORY — PX: COLONOSCOPY WITH PROPOFOL: SHX5780

## 2018-07-02 HISTORY — DX: Gout, unspecified: M10.9

## 2018-07-02 HISTORY — DX: Presence of dental prosthetic device (complete) (partial): Z97.2

## 2018-07-02 SURGERY — COLONOSCOPY WITH PROPOFOL
Anesthesia: General | Site: Rectum

## 2018-07-02 MED ORDER — LACTATED RINGERS IV SOLN
INTRAVENOUS | Status: DC
  Administered 2018-07-02: 07:00:00 via INTRAVENOUS

## 2018-07-02 MED ORDER — STERILE WATER FOR IRRIGATION IR SOLN
Status: DC | PRN
  Administered 2018-07-02: 08:00:00

## 2018-07-02 MED ORDER — ACETAMINOPHEN 160 MG/5ML PO SOLN: 325.0000 mg | Freq: Once | ORAL | Status: DC

## 2018-07-02 MED ORDER — ACETAMINOPHEN 325 MG PO TABS: 325.0000 mg | ORAL_TABLET | Freq: Once | ORAL | Status: DC

## 2018-07-02 MED ORDER — PROPOFOL 10 MG/ML IV BOLUS
INTRAVENOUS | Status: DC | PRN
  Administered 2018-07-02 (×2): 50 mg via INTRAVENOUS
  Administered 2018-07-02: 100 mg via INTRAVENOUS
  Administered 2018-07-02: 50 mg via INTRAVENOUS

## 2018-07-02 MED ORDER — SODIUM CHLORIDE 0.9 % IV SOLN: INTRAVENOUS | Status: DC

## 2018-07-02 MED ORDER — LIDOCAINE HCL (CARDIAC) PF 100 MG/5ML IV SOSY
PREFILLED_SYRINGE | INTRAVENOUS | Status: DC | PRN
  Administered 2018-07-02: 50 mg via INTRAVENOUS

## 2018-07-02 SURGICAL SUPPLY — 5 items
CANISTER SUCT 1200ML W/VALVE (MISCELLANEOUS) ×3 IMPLANT
GOWN CVR UNV OPN BCK APRN NK (MISCELLANEOUS) ×2 IMPLANT
GOWN ISOL THUMB LOOP REG UNIV (MISCELLANEOUS) ×4
KIT ENDO PROCEDURE OLY (KITS) ×3 IMPLANT
WATER STERILE IRR 250ML POUR (IV SOLUTION) ×3 IMPLANT

## 2018-07-02 NOTE — Transfer of Care (Signed)
Immediate Anesthesia Transfer of Care Note  Patient: Alicia Bridges  Procedure(s) Performed: COLONOSCOPY WITH PROPOFOL (N/A Rectum)  Patient Location: PACU  Anesthesia Type: General  Level of Consciousness: awake, alert  and patient cooperative  Airway and Oxygen Therapy: Patient Spontanous Breathing and Patient connected to supplemental oxygen  Post-op Assessment: Post-op Vital signs reviewed, Patient's Cardiovascular Status Stable, Respiratory Function Stable, Patent Airway and No signs of Nausea or vomiting  Post-op Vital Signs: Reviewed and stable  Complications: No apparent anesthesia complications

## 2018-07-02 NOTE — H&P (Signed)
Lucilla Lame, MD [address] [place], [place] [address] Phone: [phone] Fax : [phone]  Primary Care Physician:  Theotis Burrow, MD Primary Gastroenterologist:  Dr. Allen Norris  Pre-Procedure History & Physical: HPI:  Alicia Bridges is a 68 y.o. female is here for a screening colonoscopy.   Past Medical History:  Diagnosis Date  . Anemia    hx of  . Gout    Hx of, in the past  . Hypercholesterolemia   . Hypertension    controlled  . Loose, teeth   . Pre-diabetes   . Wears dentures    partial upper and lower  . Wears partial dentures    X2    Past Surgical History:  Procedure Laterality Date  . BREAST BIOPSY Right 10/16/2017   papilloma  . BREAST BIOPSY Right 12/27/2017   Procedure: BREAST BIOPSY WITH NEEDLE LOCALIZATION;  Surgeon: Florene Glen, MD;  Location: [facility] ORS;  Service: General;  Laterality: Right;  . BREAST EXCISIONAL BIOPSY Right 12/27/2017   path pending  . COLONOSCOPY    . TUBAL LIGATION      Prior to Admission medications   Medication Sig Start Date End Date Taking? Authorizing Provider  atorvastatin (LIPITOR) 10 MG tablet Take 10 mg by mouth daily. pm   Yes [provider]  lisinopril-hydrochlorothiazide (PRINZIDE,ZESTORETIC) 10-12.5 MG tablet Take 1 tablet by mouth daily. pm   Yes [provider]  Cyanocobalamin (B-12 SL) Place 1 Dose under the tongue daily as needed (calming).    [provider]    Allergies as of 06/21/2018  . (No Known Allergies)    Family History  Problem Relation Age of Onset  . Heart failure Mother   . Stroke Father   . Breast cancer Neg Hx     Social History   Socioeconomic History  . Marital status: Single    Spouse name: Not on file  . Number of children: Not on file  . Years of education: Not on file  . Highest education level: Not on file  Occupational History  . Not on file  Social Needs  . Financial resource strain: Not on file  . Food  insecurity:    Worry: Not on file    Inability: Not on file  . Transportation needs:    Medical: Not on file    Non-medical: Not on file  Tobacco Use  . Smoking status: Former Smoker    Packs/day: 0.25    Years: 12.00    Pack years: 3.00    Types: Cigarettes    Last attempt to quit: 11/08/1998    Years since quitting: 19.6  . Smokeless tobacco: Never Used  Substance and Sexual Activity  . Alcohol use: Never    Frequency: Never  . Drug use: Never  . Sexual activity: Not on file  Lifestyle  . Physical activity:    Days per week: Not on file    Minutes per session: Not on file  . Stress: Not on file  Relationships  . Social connections:    Talks on phone: Not on file    Gets together: Not on file    Attends religious service: Not on file    Active member of club or organization: Not on file    Attends meetings of clubs or organizations: Not on file    Relationship status: Not on file  . Intimate partner violence:    Fear of current or ex partner: Not on file  Emotionally abused: Not on file    Physically abused: Not on file    Forced sexual activity: Not on file  Other Topics Concern  . Not on file  Social History Narrative  . Not on file    Review of Systems: See HPI, otherwise negative ROS  Physical Exam: BP 105/73   Pulse 86   Temp (!) 97.3 F (36.3 C)   Resp 18   Ht 5\' 4"  (1.626 m)   Wt 91.2 kg   SpO2 100%   BMI 34.50 kg/m  General:   Alert,  pleasant and cooperative in NAD Head:  Normocephalic and atraumatic. Neck:  Supple; no masses or thyromegaly. Lungs:  Clear throughout to auscultation.    Heart:  Regular rate and rhythm. Abdomen:  Soft, nontender and nondistended. Normal bowel sounds, without guarding, and without rebound.   Neurologic:  Alert and  oriented x4;  grossly normal neurologically.  Impression/Plan: Alicia Bridges is now here to undergo a screening colonoscopy.  Risks, benefits, and alternatives regarding colonoscopy have  been reviewed with the patient.  Questions have been answered.  All parties agreeable.

## 2018-07-02 NOTE — Anesthesia Postprocedure Evaluation (Signed)
Anesthesia Post Note  Patient: Alicia Bridges  Procedure(s) Performed: COLONOSCOPY WITH PROPOFOL (N/A Rectum)  Patient location during evaluation: PACU Anesthesia Type: General Level of consciousness: awake and alert and oriented Pain management: satisfactory to patient Vital Signs Assessment: post-procedure vital signs reviewed and stable Respiratory status: spontaneous breathing, nonlabored ventilation and respiratory function stable Cardiovascular status: blood pressure returned to baseline and stable Postop Assessment: Adequate PO intake and No signs of nausea or vomiting Anesthetic complications: no    Raliegh Ip

## 2018-07-02 NOTE — Anesthesia Preprocedure Evaluation (Signed)
Anesthesia Evaluation  Patient identified by MRN, date of birth, ID band Patient awake    Reviewed: Allergy & Precautions, H&P , NPO status , Patient's Chart, lab work & pertinent test results  Airway Mallampati: II  TM Distance: >3 FB Neck ROM: full    Dental  (+) Dental Advidsory Given, Poor Dentition   Pulmonary former smoker,    Pulmonary exam normal breath sounds clear to auscultation       Cardiovascular hypertension, Normal cardiovascular exam Rhythm:regular Rate:Normal     Neuro/Psych    GI/Hepatic   Endo/Other    Renal/GU      Musculoskeletal   Abdominal   Peds  Hematology   Anesthesia Other Findings Missing, and most remaining are loose  Reproductive/Obstetrics                             Anesthesia Physical Anesthesia Plan  ASA: II  Anesthesia Plan: General   Post-op Pain Management:    Induction: Intravenous  PONV Risk Score and Plan: 3 and Propofol infusion and Treatment may vary due to age or medical condition  Airway Management Planned: Natural Airway  Additional Equipment:   Intra-op Plan:   Post-operative Plan:   Informed Consent: I have reviewed the patients History and Physical, chart, labs and discussed the procedure including the risks, benefits and alternatives for the proposed anesthesia with the patient or authorized representative who has indicated his/her understanding and acceptance.       Plan Discussed with: CRNA  Anesthesia Plan Comments:         Anesthesia Quick Evaluation

## 2018-07-02 NOTE — Op Note (Signed)
[facility] Patient Name: Alicia Bridges Procedure Date: 07/02/2018 7:38 AM MRN: [id number] Account #: [id number] Date of Birth: 1950-08-23 Admit Type: Outpatient Age: 68 Room: [facility] OR ROOM 01 Gender: Female Note Status: Finalized Procedure:            Colonoscopy Indications:          Screening for colorectal malignant neoplasm Providers:            Lucilla Lame MD, MD Referring MD:         Elyse Jarvis Revelo (Referring MD) Medicines:            Propofol per Anesthesia Complications:        No immediate complications. Procedure:            Pre-Anesthesia Assessment:                       - Prior to the procedure, a History and Physical was                        performed, and patient medications and allergies were                        reviewed. The patient's tolerance of previous                        anesthesia was also reviewed. The risks and benefits of                        the procedure and the sedation options and risks were                        discussed with the patient. All questions were                        answered, and informed consent was obtained. Prior                        Anticoagulants: The patient has taken no previous                        anticoagulant or antiplatelet agents. ASA Grade                        Assessment: II - A patient with mild systemic disease.                        After reviewing the risks and benefits, the patient was                        deemed in satisfactory condition to undergo the                        procedure.                       After obtaining informed consent, the colonoscope was                        passed under direct vision. Throughout the procedure,  the patient's blood pressure, pulse, and oxygen                        saturations were monitored continuously. The was                        introduced through the anus and advanced to the the                         cecum, identified by appendiceal orifice and ileocecal                        valve. The colonoscopy was performed without                        difficulty. The patient tolerated the procedure well.                        The quality of the bowel preparation was excellent. Findings:      The perianal and digital rectal examinations were normal.      Multiple small-mouthed diverticula were found in the sigmoid colon.      Non-bleeding internal hemorrhoids were found during retroflexion. The       hemorrhoids were Grade I (internal hemorrhoids that do not prolapse). Impression:           - Diverticulosis in the sigmoid colon.                       - Non-bleeding internal hemorrhoids.                       - No specimens collected. Recommendation:       - Discharge patient to home.                       - Resume previous diet.                       - Continue present medications.                       - Repeat colonoscopy in 10 years for screening unless                        any change in family history or lower GI problems. Procedure Code(s):    --- Professional ---                       [phone], Colonoscopy, flexible; diagnostic, including                        collection of specimen(s) by brushing or washing, when                        performed (separate procedure) Diagnosis Code(s):    --- Professional ---                       Z12.11, Encounter for screening for malignant neoplasm                        of colon CPT copyright 2018 American  Medical Association. All rights reserved. The codes documented in this report are preliminary and upon coder review may  be revised to meet current compliance requirements. Lucilla Lame MD, MD 07/02/2018 8:05:47 AM This report has been signed electronically. Number of Addenda: 0 Note Initiated On: 07/02/2018 7:38 AM Scope Withdrawal Time: 0 hours 7 minutes 44 seconds  Total Procedure Duration: 0 hours 11 minutes 32  seconds       [facility]

## 2018-07-02 NOTE — Anesthesia Procedure Notes (Signed)
Date/Time: 07/02/2018 7:49 AM Performed by: Cameron Ali, CRNA Pre-anesthesia Checklist: Patient identified, Emergency Drugs available, Suction available, Timeout performed and Patient being monitored Patient Re-evaluated:Patient Re-evaluated prior to induction Oxygen Delivery Method: Nasal cannula Placement Confirmation: positive ETCO2

## 2018-07-03 ENCOUNTER — Encounter: Payer: Self-pay | Admitting: Gastroenterology

## 2018-07-03 ENCOUNTER — Encounter: Payer: Medicare Other | Admitting: Surgery

## 2018-07-10 ENCOUNTER — Ambulatory Visit (INDEPENDENT_AMBULATORY_CARE_PROVIDER_SITE_OTHER): Payer: Medicare Other | Admitting: Surgery

## 2018-07-10 ENCOUNTER — Other Ambulatory Visit: Payer: Self-pay

## 2018-07-10 ENCOUNTER — Encounter: Payer: Self-pay | Admitting: Surgery

## 2018-07-10 VITALS — BP 163/95 | HR 75 | Temp 95.9°F | Resp 16 | Ht 64.0 in | Wt 202.8 lb

## 2018-07-10 DIAGNOSIS — R928 Other abnormal and inconclusive findings on diagnostic imaging of breast: Secondary | ICD-10-CM

## 2018-07-10 DIAGNOSIS — L918 Other hypertrophic disorders of the skin: Secondary | ICD-10-CM | POA: Diagnosis not present

## 2018-07-10 NOTE — Progress Notes (Signed)
Surgical Clinic Progress/Follow-up Note   HPI:  68 y.o. Female presents to clinic for follow-up evaluation 6 months s/p excision of Right breast retroareolar intraductal papilloma with with wire localization following benign breast MRI and core needle biopsy. Patient reports she's been feeling well and denies any new palpable breast mass, pain, or nipple drainage and likewise denies fever/chills, unintentional weight loss, N/V, CP, or SOB. Of note, she does complain regarding a Right axillary/chest wall skin tag that has been causing her increasing pain when her bra strap rubs it, for which she's been applying daily adhesive bandages to partially prevent/alleviate her pain. Regarding this, she denies any surrounding redness or drainage from the lesion.  Review of Systems:  Constitutional: denies any other weight loss, fever, chills, or sweats  Eyes: denies any other vision changes, history of eye injury  ENT: denies sore throat, hearing problems  Respiratory: denies shortness of breath, wheezing  Cardiovascular: denies chest pain, palpitations  Gastrointestinal: denies abdominal pain, N/V, or diarrhea Musculoskeletal: denies any other joint pains or cramps  Skin: Denies any other rashes or skin discolorations  Neurological: denies any other headache, dizziness, weakness  Psychiatric: denies any other depression, anxiety  All other review of systems: otherwise negative   Vital Signs:  BP (!) 163/95   Pulse 75   Temp (!) 95.9 F (35.5 C) (Skin)   Resp 16   Ht 5\' 4"  (1.626 m)   Wt 202 lb 12.8 oz (92 kg)   SpO2 100%   BMI 34.81 kg/m    Physical Exam:  Constitutional:  -- Overweight body habitus  -- Awake, alert, and oriented x3  Eyes:  -- Pupils equally round and reactive to light  -- No scleral icterus  Ear, nose, throat:  -- No jugular venous distension  -- No nasal drainage, bleeding Pulmonary:  -- No crackles -- Equal breath sounds bilaterally -- Breathing non-labored  at rest Cardiovascular:  -- S1, S2 present  -- No pericardial rubs Breasts: -- Well-healed Right breast post-surgical incisional scar without surrounding erythema or drainage -- Bilaterally no palpable breast mass, axillary lymphadenopathy, nipple drainage, or tenderness to palpation Gastrointestinal:  -- Soft, nontender, non-distended, no guarding/rebound  -- No abdominal masses appreciated, pulsatile or otherwise  Musculoskeletal / Integumentary:  -- Wounds or skin discoloration: mildly-/moderately- tender to palpation 1 cm Right axillary/chest wall skin tag without surrounding erythema or drainage -- Extremities: B/L UE and LE FROM, hands and feet warm, no edema  Neurologic:  -- Motor function: intact and symmetric  -- Sensation: intact and symmetric   Laboratory studies:  CBC Latest Ref Rng & Units 12/26/2017  WBC 3.6 - 11.0 K/uL 4.9  Hemoglobin 12.0 - 16.0 g/dL 10.8(L)  Hematocrit 35.0 - 47.0 % 32.7(L)  Platelets 150 - 440 K/uL 237   CMP Latest Ref Rng & Units 12/26/2017  Glucose 70 - 99 mg/dL 110(H)  BUN 8 - 23 mg/dL 18  Creatinine 0.44 - 1.00 mg/dL 1.16(H)  Sodium 135 - 145 mmol/L 143  Potassium 3.5 - 5.1 mmol/L 3.9  Chloride 98 - 111 mmol/L 110  CO2 22 - 32 mmol/L 24  Calcium 8.9 - 10.3 mg/dL 9.3   Imaging: No new pertinent imaging studies available for review at this time   Assessment:  68 y.o. yo Female with a problem list including...  Patient Active Problem List   Diagnosis Date Noted  . Encounter for screening colonoscopy   . Abnormal mammogram     presents to clinic for follow-up evaluation  6 months s/p excision of Right breast retroareolar intraductal papilloma with with wire localization following benign breast MRI and core needle biopsy, doing overall well, though no follow-up imaging available and patient complains of an increasingly painful Right axillary/chest wall skin tag without signs/symptoms of infection.  Plan:   - will order Right diagnostic  mammogram  - if no suspicious abnormalities on Right diagnostic mammogram, bilateral mammogram in 6 months  - all risks, benefits, and alternatives to in-office excision of her increasingly symptomatic (painful) 1 cm Right axillary/chest wall skin tag were discussed with the patient, all of her questions were answered to her expressed satisfaction, patient expresses she wishes to proceed, and informed consent was obtained.  - return to clinic following upcoming diagnostic Right mammogram to discuss results and for in-office excision of Right axillary/chest wall skin tag  - instructed to call office if any questions or concerns  All of the above recommendations were discussed with the patient, and all of patient's questions were answered to her expressed satisfaction.  -- Marilynne Drivers [name], MD, [place]: [facility] - Partnering for exceptional care. Office: [phone]

## 2018-07-10 NOTE — Patient Instructions (Addendum)
The patient is aware to call back for any questions or new concerns.  Schedule a Right diagnostic mammogram. Schedule office excision right breast skin tag.  Follow up in 6 months with a bilateral screening mammogram and a office visit.

## 2018-07-18 ENCOUNTER — Ambulatory Visit
Admission: RE | Admit: 2018-07-18 | Discharge: 2018-07-18 | Disposition: A | Discharge status: Home / Self Care | Payer: Medicare Other | Source: Ambulatory Visit | Attending: Surgery | Admitting: Surgery

## 2018-07-18 DIAGNOSIS — R928 Other abnormal and inconclusive findings on diagnostic imaging of breast: Secondary | ICD-10-CM | POA: Insufficient documentation

## 2018-07-23 ENCOUNTER — Encounter: Payer: Self-pay | Admitting: Surgery

## 2018-07-24 ENCOUNTER — Encounter: Payer: Self-pay | Admitting: Surgery

## 2018-07-24 ENCOUNTER — Ambulatory Visit (INDEPENDENT_AMBULATORY_CARE_PROVIDER_SITE_OTHER): Payer: Medicare Other | Admitting: Surgery

## 2018-07-24 ENCOUNTER — Other Ambulatory Visit: Payer: Self-pay

## 2018-07-24 VITALS — BP 163/85 | HR 80 | Temp 97.2°F | Resp 14 | Ht 64.0 in | Wt 203.0 lb

## 2018-07-24 DIAGNOSIS — L918 Other hypertrophic disorders of the skin: Secondary | ICD-10-CM | POA: Diagnosis not present

## 2018-07-24 DIAGNOSIS — R222 Localized swelling, mass and lump, trunk: Secondary | ICD-10-CM | POA: Diagnosis not present

## 2018-07-24 HISTORY — DX: Localized swelling, mass and lump, trunk: R22.2

## 2018-07-24 NOTE — Patient Instructions (Addendum)
Bilateral Screening Mammogram and office follow up in 6 months.   The patient is aware to call back for any questions or new concerns.

## 2018-07-24 NOTE — Procedures (Signed)
SURGICAL OPERATIVE REPORT  DATE OF PROCEDURE: 07/24/2018  ATTENDING: Corene Cornea E. [name], MD  ANESTHESIA: Local  PRE-OPERATIVE DIAGNOSIS: Increasingly symptomatic (painful) non-infected Right axillary / lateral chest wall cutaneous mass (icd-10: R22.2)   POST-OPERATIVE DIAGNOSIS: Increasingly symptomatic (painful) non-infected Right axillary / lateral chest wall cutaneous mass (icd-10: R22.2)   PROCEDURE(S):  1.) Excision of symptomatic (painful) non-infected Right axillary / lateral chest wall cutaneous mass (cpt: 11402)   INTRAOPERATIVE FINDINGS: 1.5 cm x 0.5 cm non-infected cutaneous mass of the patient's Right axilla / lateral chest wall (most likely a skin tag), removed intact  INTRAVENOUS FLUIDS: 0 mL crystalloid   ESTIMATED BLOOD LOSS: Minimal (< 20 mL)  URINE OUTPUT: No Foley  SPECIMENS: 1.5 cm x 0.5 cm non-infected cutaneous mass of the Right axilla / lateral chest wall (most likely a skin tag), removed intact  IMPLANTS: None  DRAINS: None  COMPLICATIONS: None apparent  CONDITION AT END OF PROCEDURE: Hemodynamically stable and awake  DISPOSITION OF PATIENT: PACU  INDICATIONS FOR PROCEDURE:  Patient is a 68 y.o. female who presented for an increasingly symptomatic (painful) non-infected Right axillary / lateral chest wall cutaneous mass. Patient reports it's been present x at least 1 year and recently has become increasingly painful due her bra strap and clothing rubbing it. Patient requested that it be removed so her activities can be performed with less discomfort, and patient was accordingly referred for surgical evaluation and management. All risks, benefits, and alternatives to above procedure were discussed with the patient, all of patient's questions were answered to his expressed satisfaction, and informed consent was obtained and documented.   DETAILS OF PROCEDURE: Patient was brought to the procedure suite and was appropriately identified. Laying on the  procedural table with her Right arm raised, operative site was prepped and draped in the usual sterile fashion, and following a brief time out, a 1.5 cm long and 0.5 cm wide elliptical incision was made using a #15 blade scalpel, and incision was extended deep around cutaneous mass using sharp + blunt dissection. During the course of dissecting free the lesion, it was not disrupted and was removed intact and handed off field for pathology. Direct pressure was applied for hemostasis. Further examination did not reveal any additional lesion/mass. Hemostasis was achieved, and the wound was copiously irrigated with unused local anesthetic. Deep tissue and dermis were re-approximated using buried interrupted 3-0 Vicryl suture. Skin was then cleaned and dried, and sterile Dermabond skin glue.  I was present for all aspects of the above procedure, and no operative complications were apparent.

## 2018-07-24 NOTE — Progress Notes (Signed)
Surgical Clinic Progress/Follow-up Note   HPI:  68 y.o. Female presents to clinic for follow-up evaluation, to discuss results of her recent Right breast diagnostic mammogram, and for in-office excision of her  increasingly symptomatic (painful) 1 cm Right axillary/chest wall cutaneous mass. Patient reports her Right axillary/lateral chest wall pain remains unchanged and continues to be worst with rubbing from her bra strap, while she denies any associated redness or drainage, newly palpable breast mass, breast pain, nipple drainage, fever/chills, unintentional weight loss, CP, or SOB.  Review of Systems:  Constitutional: denies any other weight loss, fever, chills, or sweats  Eyes: denies any other vision changes, history of eye injury  ENT: denies sore throat, hearing problems  Respiratory: denies shortness of breath, wheezing  Cardiovascular: denies chest pain, palpitations  Gastrointestinal: denies abdominal pain, N/V, or diarrhea Musculoskeletal: denies any other joint pains or cramps  Skin: Denies any other rashes or skin discolorations  Neurological: denies any other headache, dizziness, weakness  Psychiatric: denies any other depression, anxiety  All other review of systems: otherwise negative   Vital Signs:  BP (!) 163/85   Pulse 80   Temp (!) 97.2 F (36.2 C) (Temporal)   Resp 14   Ht 5\' 4"  (1.626 m)   Wt 203 lb (92.1 kg)   SpO2 96%   BMI 34.84 kg/m    Physical Exam:  Constitutional:  -- Overweight body habitus  -- Awake, alert, and oriented x3  Eyes:  -- Pupils equally round and reactive to light  -- No scleral icterus  Ear, nose, throat:  -- No jugular venous distension  -- No nasal drainage, bleeding Pulmonary:  -- No crackles -- Equal breath sounds bilaterally -- Breathing non-labored at rest Cardiovascular:  -- S1, S2 present  -- No pericardial rubs  Breasts: -- Well-healed Right breast post-surgical incisional scar without surrounding erythema or  drainage -- Bilaterally no palpable breast mass, axillary lymphadenopathy, nipple drainage, or tenderness to palpation Gastrointestinal:  -- Soft, nontender, non-distended, no guarding/rebound  -- No abdominal masses appreciated, pulsatile or otherwise  Musculoskeletal / Integumentary:  -- Wounds or skin discoloration: mildly-/moderately- tender to palpation 1.5 cm x 0.5 cm Right axillary/chest wall cutaneous mass, most likely an inflamed skin tag, without surrounding erythema or drainage -- Extremities: B/L UE and LE FROM, hands and feet warm, no edema  Neurologic:  -- Motor function: intact and symmetric  -- Sensation: intact and symmetric   Imaging:  Right Breast Diagnostic Mammogram (07/18/2018) Mammographically, there are no suspicious masses, areas of nonsurgical architectural distortion or microcalcifications in the right breast. Expected postsurgical changes from excisional biopsy in the right 12 o'clock breast. There is 1 remaining dumbbell-shaped marker in the right breast, from benign MRI guided core needle biopsy (fibrocystic changes).  BI-RADS CATEGORY  2: Benign.  Assessment:  68 y.o. yo Female presents to clinic for follow-up evaluation, to discuss results of her recent Right breast diagnostic mammogram, and for in-office excision of her  increasingly symptomatic (painful) 1.5 cm x 0.5 cm Right axillary/lateral chest wall cutaneous mass, most likely consistent with an inflamed skin tag.  Plan:   - results of Right diagnostic mammogram discussed  - all risks, benefits, and alternatives to in-office excision of patient's increasingly symptomatic (painful) Right axillary/chest wall skin tag were discussed with the patient, all of her questions were answered to her expressed satisfaction, patient expresses she wishes to proceed, and informed consent was accordingly obtained  - in-office excision of Right axillary/chest wall skin tag performed (  see procedural report)  - return  to clinic in 2 weeks, instructed to call office if any questions or concerns  All of the above recommendations were discussed with the patient, and all of patient's questions were answered to her expressed satisfaction.  -- Marilynne Drivers [name], MD, [place]: [facility] - Partnering for exceptional care. Office: [phone]

## 2018-08-09 ENCOUNTER — Other Ambulatory Visit: Payer: Self-pay

## 2018-08-09 ENCOUNTER — Ambulatory Visit (INDEPENDENT_AMBULATORY_CARE_PROVIDER_SITE_OTHER): Payer: Medicare Other | Admitting: Surgery

## 2018-08-09 ENCOUNTER — Encounter: Payer: Self-pay | Admitting: Surgery

## 2018-08-09 DIAGNOSIS — R222 Localized swelling, mass and lump, trunk: Secondary | ICD-10-CM

## 2018-08-09 DIAGNOSIS — Z4889 Encounter for other specified surgical aftercare: Secondary | ICD-10-CM

## 2018-08-09 NOTE — Progress Notes (Signed)
Virtual Visit via Telephone Note  I connected with Alicia Bridges on 08/09/18 at 10:15 AM EDT by telephone and verified that I am speaking with the correct person using two identifiers.   I discussed the limitations, risks, security and privacy concerns of performing an evaluation and management service by telephone and the availability of in person appointments. I also discussed with the patient that there may be a patient responsible charge related to this service. The patient expressed understanding and agreed to proceed.  History of Present Illness: 68 y.o. Female is being evaluated for post-op follow-up 16 Days s/p in-office excision of her symptomatic (painful) non-infected Right axillary lateral chest wall cutaneous mass Alicia Bridges, 07/24/2018). Patient reports complete resolution of pre-operative pain and has been keeping a dry gauze dressing over her wound to reduce rubbing of her bra strap on her post-surgical wound. She otherwise denies any fever/chills, CP, or SOB. Patient further describes incision(s) as well-approximated without any surrounding redness or any associated drainage, purulent or otherwise.  Review of Systems:  Constitutional: denies fever/chills  Respiratory: denies shortness of breath, wheezing  Cardiovascular: denies chest pain, palpitations  Gastrointestinal: denies abdominal pain, N/V, or diarrhea Skin: Denies any other rashes or skin discolorations except post-surgical wounds as per interval history  Imaging: No new pertinent imaging available for review   Assessment:  68 y.o. yo Female with a problem list including...  Patient Active Problem List   Diagnosis Date Noted  . Mass of right chest wall 07/24/2018    doing well at time of current post-surgical encounter/evaluation 16 Days s/p in-office excision of her symptomatic (painful) non-infected Right axillary lateral chest wall cutaneous mass Alicia Bridges, 07/24/2018).  Plan / Follow-up Instructions:         - advance diet as tolerated  - informed of surgical pathology results             - okay to submerge incisions under water (baths, swimming) prn             - gradually resume all activities without restrictions over next 2 weeks             - apply sunblock particularly to incisions with sun exposure to reduce pigmentation of scars             - patient advised she may be contacted to schedule a subsequent appointment/exam when such routine follow-up appointments may be performed, though may certainly decline a subsequent appointment if wishes to do so             - otherwise, return to clinic following subsequent upcoming mammogram  - instructed to call office if any questions or concerns  All of the above recommendations were discussed with the patient, and all of patient's questions were answered to her expressed satisfaction.  I provided 15 minutes of non-face-to-face time during this encounter.  -- Marilynne Drivers Alicia Hoes, MD, [place]: [facility] for exceptional care. Office: [phone]

## 2018-11-26 ENCOUNTER — Other Ambulatory Visit: Payer: Self-pay | Admitting: Family Medicine

## 2018-11-26 DIAGNOSIS — Z13820 Encounter for screening for osteoporosis: Secondary | ICD-10-CM

## 2018-11-26 DIAGNOSIS — Z1231 Encounter for screening mammogram for malignant neoplasm of breast: Secondary | ICD-10-CM

## 2018-12-03 ENCOUNTER — Other Ambulatory Visit: Payer: Self-pay | Admitting: *Deleted

## 2018-12-03 DIAGNOSIS — Z1231 Encounter for screening mammogram for malignant neoplasm of breast: Secondary | ICD-10-CM

## 2018-12-03 DIAGNOSIS — R928 Other abnormal and inconclusive findings on diagnostic imaging of breast: Secondary | ICD-10-CM

## 2018-12-11 ENCOUNTER — Telehealth: Payer: Self-pay

## 2018-12-11 NOTE — Telephone Encounter (Signed)
Left message to return call regarding mammogram and follow up with Dr.Piscoya.

## 2019-01-10 ENCOUNTER — Ambulatory Visit
Admission: RE | Admit: 2019-01-10 | Discharge: 2019-01-10 | Disposition: A | Discharge status: Home / Self Care | Payer: Medicare Other | Source: Ambulatory Visit | Attending: Family Medicine | Admitting: Family Medicine

## 2019-01-10 DIAGNOSIS — Z780 Asymptomatic menopausal state: Secondary | ICD-10-CM | POA: Diagnosis not present

## 2019-01-10 DIAGNOSIS — Z13820 Encounter for screening for osteoporosis: Secondary | ICD-10-CM

## 2019-01-23 ENCOUNTER — Ambulatory Visit
Admission: RE | Admit: 2019-01-23 | Discharge: 2019-01-23 | Disposition: A | Discharge status: Home / Self Care | Payer: Medicare Other | Source: Ambulatory Visit | Attending: Surgery | Admitting: Surgery

## 2019-01-23 DIAGNOSIS — Z1231 Encounter for screening mammogram for malignant neoplasm of breast: Secondary | ICD-10-CM | POA: Insufficient documentation

## 2019-01-30 ENCOUNTER — Ambulatory Visit: Payer: Medicare Other | Admitting: Surgery

## 2019-01-30 ENCOUNTER — Encounter: Payer: Self-pay | Admitting: Surgery

## 2019-01-30 ENCOUNTER — Other Ambulatory Visit: Payer: Self-pay

## 2019-01-30 VITALS — BP 184/112 | HR 83 | Temp 97.7°F | Ht 67.0 in | Wt 161.0 lb

## 2019-01-30 DIAGNOSIS — D241 Benign neoplasm of right breast: Secondary | ICD-10-CM | POA: Diagnosis not present

## 2019-01-30 NOTE — Progress Notes (Signed)
01/30/2019  History of Present Illness: Alicia Bridges is a 68 y.o. 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 Burt Knack.  She also had a right chest wall skin tag removed by Dr. [name] on 07/24/18.  She presents for follow up.  She had a post-op mammogram on 07/2018 and a follow up mammogram on 01/24/19.  She denies any new palpable masses, skin changes, or nipple changes.  She reports some occasional burning sensation at the skin tag resection site depending on which bra she wears.  Past Medical History: Past Medical History:  Diagnosis Date  . Anemia    hx of  . Gout    Hx of, in the past  . Hypercholesterolemia   . Hypertension    controlled  . Loose, teeth   . Mass of right chest wall 07/24/2018  . Pre-diabetes   . Wears dentures    partial upper and lower  . Wears partial dentures    X2     Past Surgical History: Past Surgical History:  Procedure Laterality Date  . BREAST BIOPSY Right 10/16/2017   papilloma/us guided  . BREAST BIOPSY Right 12/27/2017  . BREAST BIOPSY Right 7/37/2019   mr guided biopsy 2 areas, neg-papilloma  . BREAST EXCISIONAL BIOPSY Right 12/27/2017   neg  . COLONOSCOPY    . COLONOSCOPY WITH PROPOFOL N/A 07/02/2018   Procedure: COLONOSCOPY WITH PROPOFOL;  Surgeon: Lucilla Lame, MD;  Location: [place];  Service: Endoscopy;  Laterality: N/A;  . TUBAL LIGATION      Home Medications: Prior to Admission medications   Medication Sig Start Date End Date Taking? Authorizing Provider  atorvastatin (LIPITOR) 10 MG tablet Take 10 mg by mouth daily. pm    [provider]  Cyanocobalamin (B-12 SL) Place 1 Dose under the tongue daily as needed (calming).    [provider]  lisinopril-hydrochlorothiazide (PRINZIDE,ZESTORETIC) 10-12.5 MG tablet Take 1 tablet by mouth daily. pm    [provider]    Allergies: No Known Allergies  Review of Systems: Review of Systems   Constitutional: Negative for chills and fever.  Respiratory: Negative for shortness of breath.   Cardiovascular: Negative for chest pain.  Gastrointestinal: Negative for nausea and vomiting.  Skin: Negative for rash.    Physical Exam BP (!) 184/112   Pulse 83   Temp 97.7 F (36.5 C) (Skin)   Ht 5\' 7"  (1.702 m)   Wt 161 lb (73 kg)   SpO2 98%   BMI 25.22 kg/m  CONSTITUTIONAL: No acute distress HEENT:  Normocephalic, atraumatic, extraocular motion intact. RESPIRATORY:  Lungs are clear, and breath sounds are equal bilaterally. Normal respiratory effort without pathologic use of accessory muscles. CARDIOVASCULAR: Heart is regular without murmurs, gallops, or rubs. BREAST:  Right breast s/p lumpectomy with periareolar incision in upper outer quadrant well healed.  No palpable masses, skin changes, or nipple changes.  Right axilla s/p skin tag resection, with palpable hypertrophied type of scar, though no evidence of infection.  No right axillary or supraclavicular lymphadenopathy.  On left breast, no palpable masses, skin changes, or nipple changes.  No left axillary or supraclavicular lymphadenopathy. NEUROLOGIC:  Motor and sensation is grossly normal.  Cranial nerves are grossly intact. PSYCH:  Alert and oriented to person, place and time. Affect is normal.  Labs/Imaging: Mammogram 01/24/19: FINDINGS: There are no findings suspicious for malignancy. Postsurgical changes are seen in the retroareolar right breast following benign excisional biopsy. Images were processed  with CAD.  IMPRESSION: No mammographic evidence of malignancy. A result letter of this screening mammogram will be mailed directly to the patient.  RECOMMENDATION: Screening mammogram in one year.  Assessment and Plan: This is a 68 y.o. female s/p right lumpectomy for intraductal papilloma and resection or right chest wall skin tag.  Discussed mammogram findings with the patient.  Reassuring exam today as well.   As her last two mammograms have been negative for any suspicious findings, I think it is ok to defer further mammograms to her PCP.  She would be due for screening mammogram in one year.  She may follow up with [place] as needed.  Face-to-face time spent with the patient and care providers was 15 minutes, with more than 50% of the time spent counseling, educating, and coordinating care of the patient.     Melvyn Neth, [facility]

## 2019-01-30 NOTE — Patient Instructions (Addendum)
Return as needed.The patient is aware to call back for any questions or concerns. Return back to PCP for mammograms

## 2019-12-26 ENCOUNTER — Other Ambulatory Visit: Payer: Self-pay | Admitting: Physician Assistant

## 2019-12-26 DIAGNOSIS — Z1231 Encounter for screening mammogram for malignant neoplasm of breast: Secondary | ICD-10-CM

## 2020-04-01 ENCOUNTER — Other Ambulatory Visit: Payer: Self-pay

## 2020-04-01 ENCOUNTER — Ambulatory Visit
Admission: RE | Admit: 2020-04-01 | Discharge: 2020-04-01 | Disposition: A | Discharge status: Home / Self Care | Payer: Medicare Other | Source: Ambulatory Visit | Attending: Physician Assistant | Admitting: Physician Assistant

## 2020-04-01 DIAGNOSIS — Z1231 Encounter for screening mammogram for malignant neoplasm of breast: Secondary | ICD-10-CM | POA: Diagnosis present

## 2020-04-04 IMAGING — MR MR BILATERAL BREAST WITHOUT AND WITH CONTRAST
8 of 11 series · 32 of 48 positions shown · IV contrast (multihance)
Comparison: No prior MRI available for comparison. Correlation made
with prior mammograms and ultrasounds.

CLINICAL DATA: 67-year-old female with recent biopsy of the right
breast indicating intraductal papilloma with focal apocrine change
and sclerosis. This is identified initially due to duct ectasia on
her screening mammogram. There is concern for possible multiple
intraductal masses on her diagnostic workup.

LABS:  Creatinine was obtained on site at [HOSPITAL] at [REDACTED] [HOSPITAL].
Results: Creatinine 1.1 mg/dL and GFR of 59.
EXAM:
BILATERAL BREAST MRI WITH AND WITHOUT CONTRAST
TECHNIQUE: Multiplanar, multisequence MR images of both breasts were obtained
prior to and following the intravenous administration of 20 ml of
MultiHance.

[Series 2: t2_tirm_tra ipat (a-p) · axial · 3.0mm · 0.78mm/px · 1 of 60 slices shown]
[im 1/60]
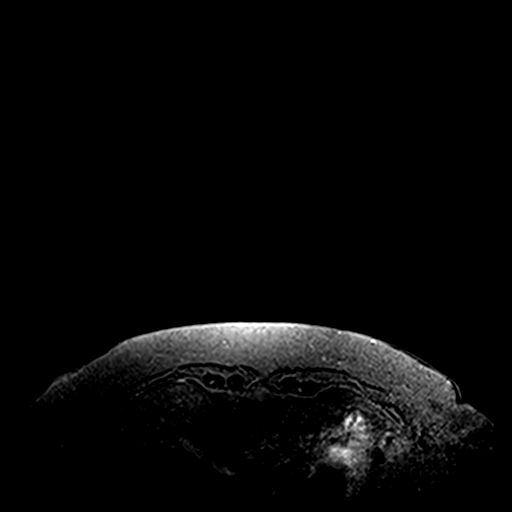

[Series 3: fl3d pre-cm no · axial · non-contrast · 1.2mm · 1.04mm/px · z∈[-34,+157]mm · 5 of 160 slices shown]
[im 1/160]
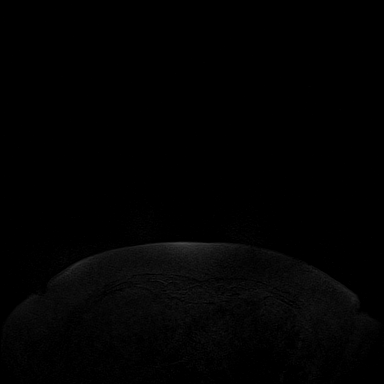
[im 40/160]
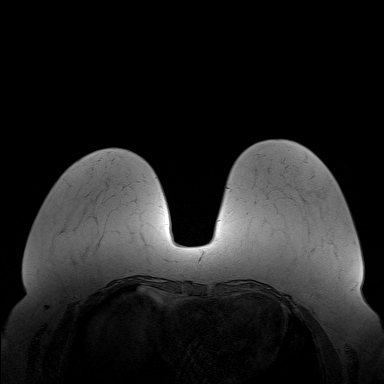
[im 80/160]
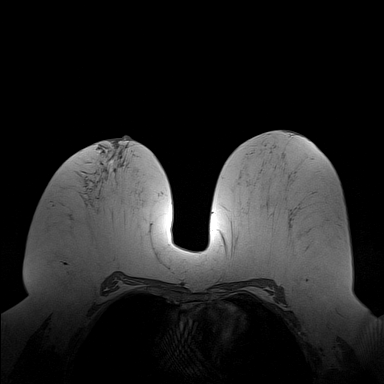
[im 120/160]
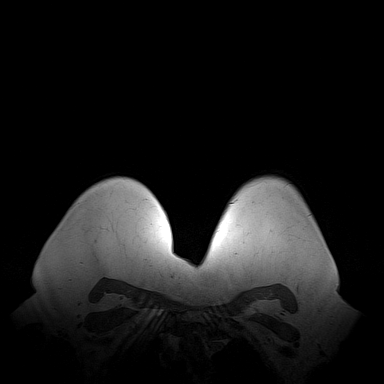
[im 160/160]
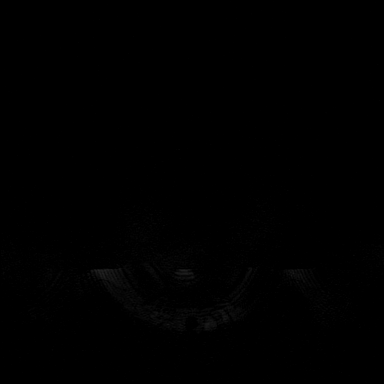

[Series 4: fl3d pre-cm · axial · non-contrast · 1.2mm · 1.04mm/px · z∈[-34,+157]mm · 5 of 160 slices shown (1 of 2)]
[im 1/160]
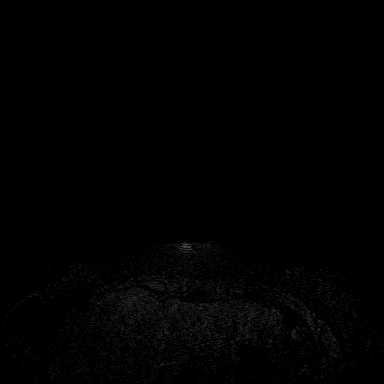
[im 40/160]
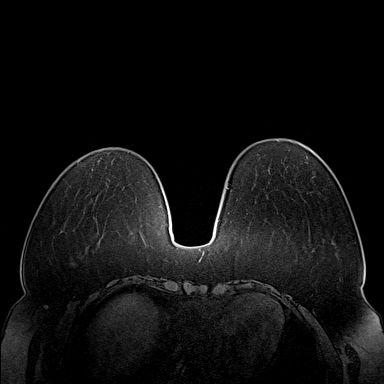
[im 80/160]
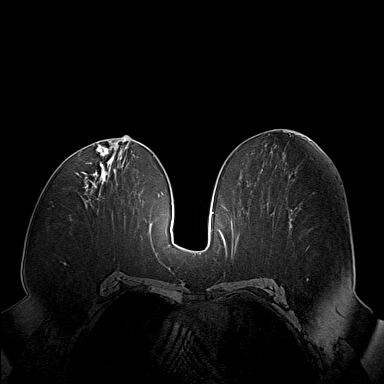
[im 120/160]
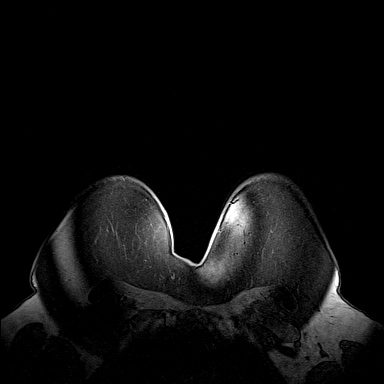
[im 160/160]
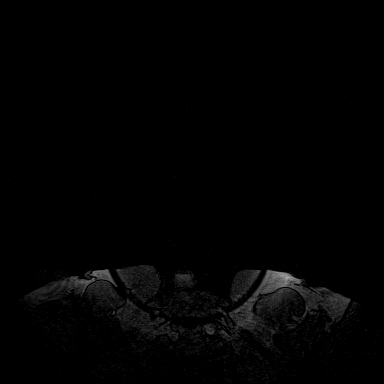

[Series 7: fl3d pre-cm · axial · non-contrast · 1.2mm · 1.04mm/px · z∈[-33,+158]mm · 5 of 160 slices shown (2 of 2)]
[im 1/160]
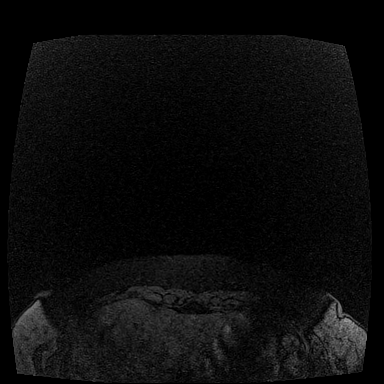
[im 40/160]
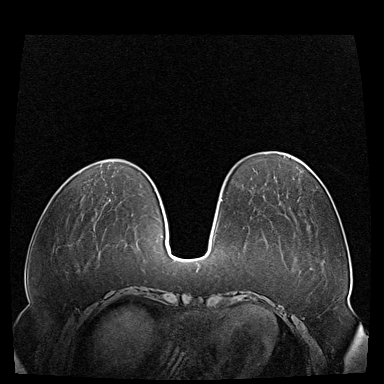
[im 80/160]
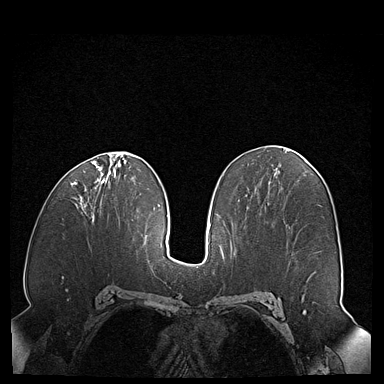
[im 120/160]
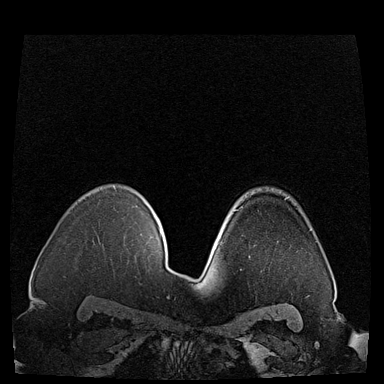
[im 160/160]
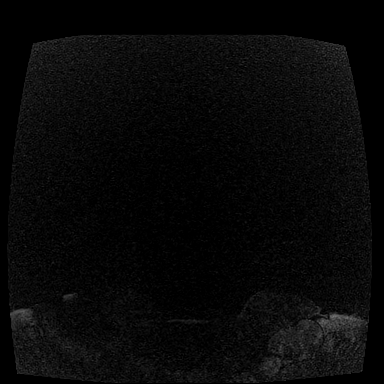

[Series 8: fl3d post-cm 20 · axial · 1.2mm · 1.04mm/px · z∈[-33,+158]mm · 6 of 160 slices shown (1 of 3)]
[im 1/160]
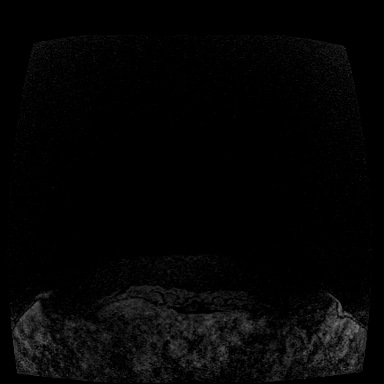
[im 32/160]
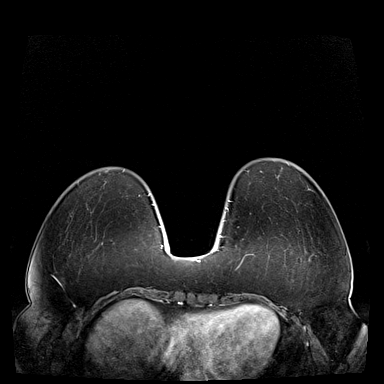
[im 64/160]
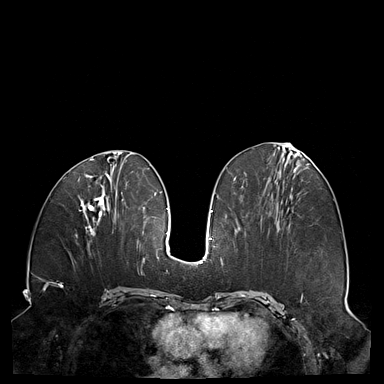
[im 96/160]
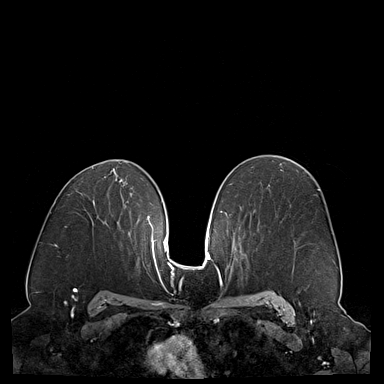
[im 128/160]
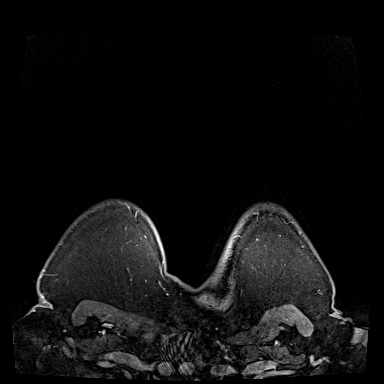
[im 160/160]
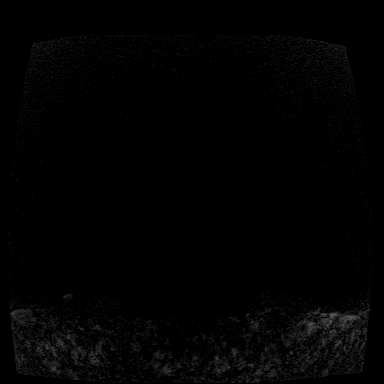

[Series 9: fl3d post-cm 20 · axial · 1.2mm · 1.04mm/px · z∈[-33,+158]mm · 6 of 160 slices shown (2 of 3)]
[im 1/160]
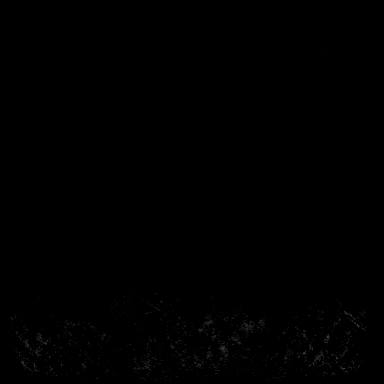
[im 32/160]
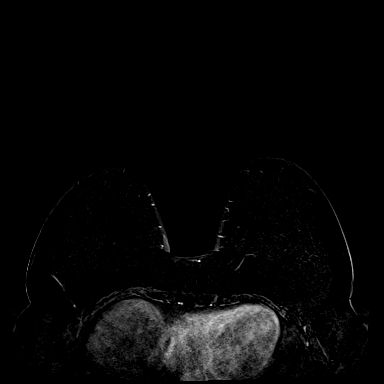
[im 64/160]
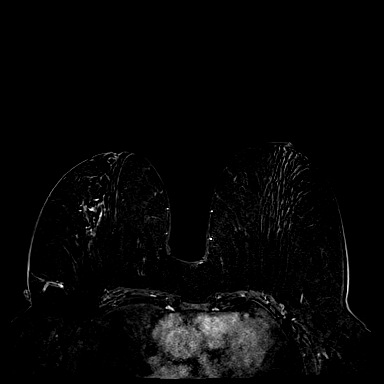
[im 96/160]
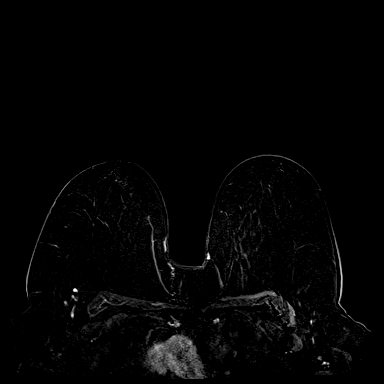
[im 128/160]
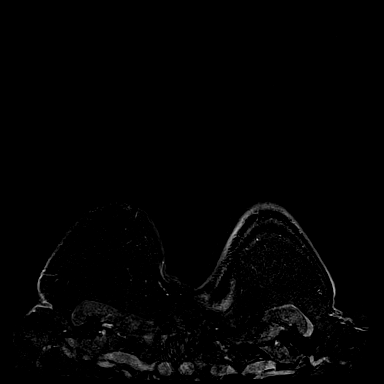
[im 160/160]
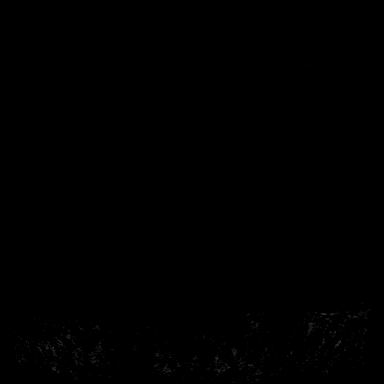

[Series 10: fl3d post-cm 20 · axial · 192.0mm · 1.04mm/px · 1 of 1 slices shown (3 of 3)]
[im 1/1]
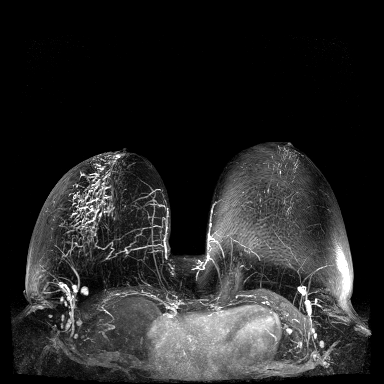

[Series 11: fl3d post-cm 3min · axial · 1.2mm · 1.04mm/px · z∈[-33,+42]mm · 3 of 160 slices shown]
[im 1/160]
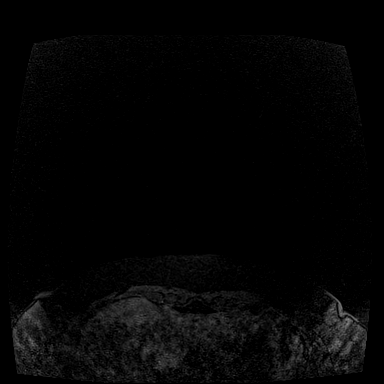
[im 32/160]
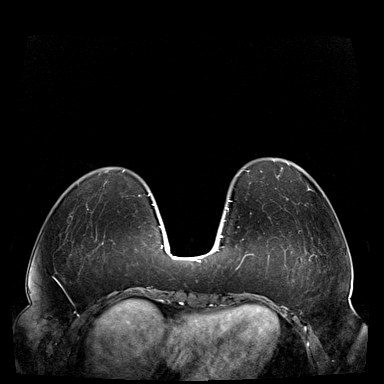
[im 64/160]
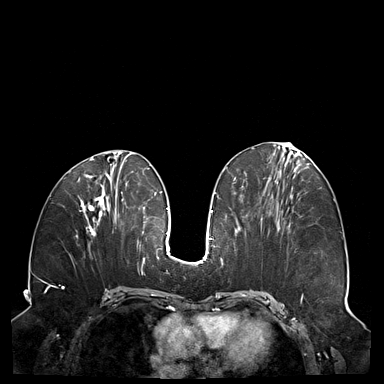

[32 of 48 positions shown; findings below may reference images not displayed]

THREE-DIMENSIONAL MR IMAGE RENDERING ON INDEPENDENT WORKSTATION:

Three-dimensional MR images were rendered by post-processing of the
original MR data on an independent workstation. The
three-dimensional MR images were interpreted, and findings are
reported in the following complete MRI report for this study. Three
dimensional images were evaluated at the independent [employer]
workstation
FINDINGS: Breast composition: b. Scattered fibroglandular tissue.

Background parenchymal enhancement: Mild.

Right breast: There is extensive intraductal enhancement
demonstrating persistent kinetics in a segmental distribution
central to lower outer quadrant of the right breast. This
enhancement spans approximately 9.5 x 3.6 x 2.5 cm. Susceptibility
artifact from the biopsy marking clip is seen in the retroareolar
location, at the anterior margin of the enhancement.

Left breast: No mass or abnormal enhancement.

Lymph nodes: No abnormal appearing lymph nodes.

Ancillary findings:  None.
IMPRESSION: 1. There is extensive intraductal enhancement in the central to
lower outer quadrant of the right breast spanning approximately
cm.

2.  No MRI evidence of left breast malignancy.

RECOMMENDATION:
MRI guided biopsy is recommended for the posterior and central
aspect of the non mass enhancement given that the previously
biopsied papilloma is at the far anterior portion of the non mass
enhancement.

BI-RADS CATEGORY  4: Suspicious.

## 2021-09-10 ENCOUNTER — Other Ambulatory Visit: Payer: Self-pay | Admitting: Physician Assistant

## 2021-09-10 DIAGNOSIS — Z1231 Encounter for screening mammogram for malignant neoplasm of breast: Secondary | ICD-10-CM

## 2021-10-27 ENCOUNTER — Ambulatory Visit
Admission: RE | Admit: 2021-10-27 | Discharge: 2021-10-27 | Disposition: A | Discharge status: Home / Self Care | Payer: Medicare Other | Source: Ambulatory Visit | Attending: Physician Assistant | Admitting: Physician Assistant

## 2021-10-27 DIAGNOSIS — Z1231 Encounter for screening mammogram for malignant neoplasm of breast: Secondary | ICD-10-CM | POA: Diagnosis present

## 2022-11-11 ENCOUNTER — Other Ambulatory Visit: Payer: Self-pay | Admitting: Nurse Practitioner

## 2022-11-11 DIAGNOSIS — Z1231 Encounter for screening mammogram for malignant neoplasm of breast: Secondary | ICD-10-CM

## 2022-12-28 ENCOUNTER — Ambulatory Visit
Admission: RE | Admit: 2022-12-28 | Discharge: 2022-12-28 | Disposition: A | Discharge status: Home / Self Care | Payer: Medicare Other | Source: Ambulatory Visit | Attending: Nurse Practitioner | Admitting: Nurse Practitioner

## 2022-12-28 DIAGNOSIS — Z1231 Encounter for screening mammogram for malignant neoplasm of breast: Secondary | ICD-10-CM | POA: Diagnosis not present

## 2023-06-23 ENCOUNTER — Ambulatory Visit
Admission: EM | Admit: 2023-06-23 | Discharge: 2023-06-23 | Disposition: A | Discharge status: Home / Self Care | Payer: Medicare Other | Attending: Family Medicine | Admitting: Family Medicine

## 2023-06-23 ENCOUNTER — Ambulatory Visit (INDEPENDENT_AMBULATORY_CARE_PROVIDER_SITE_OTHER): Payer: Medicare Other

## 2023-06-23 DIAGNOSIS — M778 Other enthesopathies, not elsewhere classified: Secondary | ICD-10-CM | POA: Diagnosis not present

## 2023-06-23 DIAGNOSIS — M25531 Pain in right wrist: Secondary | ICD-10-CM

## 2023-06-23 DIAGNOSIS — I10 Essential (primary) hypertension: Secondary | ICD-10-CM

## 2023-06-23 MED ORDER — PREDNISONE 10 MG (21) PO TBPK: ORAL_TABLET | Freq: Every day | ORAL | 0 refills | Status: AC

## 2023-06-23 MED ORDER — NAPROXEN 500 MG PO TABS: 500.0000 mg | ORAL_TABLET | Freq: Two times a day (BID) | ORAL | 0 refills | Status: AC

## 2023-06-23 NOTE — ED Provider Notes (Signed)
 [facility]    CSN: [id number] Arrival date & time: 06/23/23  1651      History   Chief Complaint Chief Complaint  Patient presents with   Wrist Pain    HPI  HPI Alicia Bridges is a 73 y.o. female.   Alicia Bridges presents for right wrist pain for the past 3 days. Has been wearing her wrist brace that she got after injuring her wrist a year or so ago carrying all of her heavy groceries from the car.  She felt a pop then and it was swollen. Every six weeks or so it flares up and then goes away.  This episode it is more painful.   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 Wednesday night, the pain got so bad she took some Excedrin.          Past Medical History:  Diagnosis Date   Anemia    hx of   Gout    Hx of, in the past   Hypercholesterolemia    Hypertension    controlled   Loose, teeth    Mass of right chest wall 07/24/2018   Pre-diabetes    Wears dentures    partial upper and lower   Wears partial dentures    X2    There are no active problems to display for this patient.   Past Surgical History:  Procedure Laterality Date   BREAST BIOPSY Right 10/16/2017   papilloma/us  guided   BREAST BIOPSY Right 12/27/2017   BREAST BIOPSY Right 7/37/2019   mr guided biopsy 2 areas, neg-papilloma   BREAST EXCISIONAL BIOPSY Right 12/27/2017   neg   COLONOSCOPY     COLONOSCOPY WITH PROPOFOL  N/A 07/02/2018   Procedure: COLONOSCOPY WITH PROPOFOL ;  Surgeon: Jinny Carmine, MD;  Location: [facility];  Service: Endoscopy;  Laterality: N/A;   TUBAL LIGATION      OB History   No obstetric history on file.      Home Medications    Prior to Admission medications   Medication Sig Start Date End Date Taking? Authorizing Provider  atorvastatin (LIPITOR) 10 MG tablet Take 10 mg by mouth daily. pm   Yes [provider]  Cyanocobalamin (B-12 SL) Place 1 Dose under the tongue daily as needed (calming).   Yes [provider]   lisinopril-hydrochlorothiazide (PRINZIDE,ZESTORETIC) 10-12.5 MG tablet Take 1 tablet by mouth daily. pm   Yes [provider]  naproxen  (NAPROSYN ) 500 MG tablet Take 1 tablet (500 mg total) by mouth 2 (two) times daily with a meal. 06/23/23  Yes Key Cen, DO  predniSONE  (STERAPRED UNI-PAK 21 TAB) 10 MG (21) TBPK tablet Take by mouth daily. Take 6 tabs by mouth daily for 1, then 5 tabs for 1 day, then 4 tabs for 1 day, then 3 tabs for 1 day, then 2 tabs for 1 day, then 1 tab for 1 day. 06/23/23  Yes Kriste Berth, DO    Family History Family History  Problem Relation Age of Onset   Heart failure Mother    Stroke Father    Breast cancer Neg Hx     Social History Social History   Tobacco Use   Smoking status: Former    Current packs/day: 0.00    Average packs/day: 0.3 packs/day for 12.0 years (3.0 ttl pk-yrs)    Types: Cigarettes    Start date: 11/08/1986    Quit date: 11/08/1998    Years since quitting: 24.6   Smokeless tobacco: Never  Vaping Use   Vaping status: Never Used  Substance Use Topics   Alcohol use: Never   Drug use: Never     Allergies   Patient has no known allergies.   Review of Systems Review of Systems: :negative unless otherwise stated in HPI.      Physical Exam Triage Vital Signs ED Triage Vitals  Encounter Vitals Group     BP 06/23/23 1800 (!) (P) 203/93     Systolic BP Percentile --      Diastolic BP Percentile --      Pulse Rate 06/23/23 1800 (P) 85     Resp 06/23/23 1800 (P) 16     Temp 06/23/23 1800 (P) 98.7 F (37.1 C)     Temp Source 06/23/23 1800 Oral     SpO2 06/23/23 1800 (P) 99 %     Weight 06/23/23 1756 195 lb (88.5 kg)     Height 06/23/23 1756 5' 4 (1.626 m)     Head Circumference --      Peak Flow --      Pain Score 06/23/23 1756 2     Pain Loc --      Pain Education --      Exclude from Growth Chart --    No data found.  Updated Vital Signs BP (!) 178/102 (BP Location: Left Arm)   Pulse (P) 85   Temp  (P) 98.7 F (37.1 C) (Oral)   Resp (P) 16   Ht 5' 4 (1.626 m)   Wt 88.5 kg   SpO2 (P) 99%   BMI 33.47 kg/m   Visual Acuity Right Eye Distance:   Left Eye Distance:   Bilateral Distance:    Right Eye Near:   Left Eye Near:    Bilateral Near:     Physical Exam GEN: well appearing female in no acute distress  CVS: well perfused, regular rate and rhythm RESP: speaking in full sentences without pause, clear bilaterally, no respiratory distress  MSK:  Wrist, Right: Inspection yielded no erythema, ecchymosis, bony deformity, or swelling. ROM limited in flexion and extension compared to the left wrist. Pain with abduction and adduction.  Palpation is normal over metacarpals and scaphoid; tendons are with tenderness but no edema. Strength 4+/5 in all directions. Positive Finkelstein.     UC Treatments / Results  Labs (all labs ordered are listed, but only abnormal results are displayed) Labs Reviewed - No data to display  EKG   Radiology DG Wrist Complete Right Result Date: 06/23/2023 CLINICAL DATA:  Wrist pain, swelling EXAM: RIGHT WRIST - COMPLETE 3+ VIEW COMPARISON:  None Available. FINDINGS: There is no evidence of fracture or dislocation. There is no evidence of arthropathy or other focal bone abnormality. Soft tissues are unremarkable. IMPRESSION: Negative. Electronically Signed   By: Franky Crease M.D.   On: 06/23/2023 19:11      Procedures Procedures (including critical care time)  Medications Ordered in UC Medications - No data to display  Initial Impression / Assessment and Plan / UC Course  I have reviewed the triage vital signs and the nursing notes.  Pertinent labs & imaging results that were available during my care of the patient were reviewed by me and considered in my medical decision making (see chart for details).      Pt is a 73 y.o.  female with 3 days of right wrist pain after waking up.  Had previous injury about a year ago.   On exam, pt has  limited ROM. Obtained right wrist plain films.  Personally interpreted by me were unremarkable for fracture or dislocation. Radiologist report reviewed. Placed in wrist splint with thumb spica.   Patient to gradually return to normal activities, as tolerated and continue ordinary activities within the limits permitted by pain. Prescribed Naproxen  sodium  and prednisone  for pain relief.  Tylenol  PRN. Advised patient to avoid OTC NSAIDs while taking prescription NSAID. Counseled patient on red flag symptoms and when to seek immediate care.   Wilmer is hypertensive here.  BP 203/93 then after sitting was 178/102. Previous blood pressures from chart review were  elevated. I suspect high blood pressure issue is being complicated by pain. Is prescribed lisinopril 10 mg  and hydrochlorothiazide 12.5 mg.  Denies needing refills. Recommended she check herblood pressure and follow up with her primary care provider in the next 2 weeks.  Return and ED precautions given. Understanding voiced. Discussed MDM, treatment plan and plan for follow-up with patient/parent who agrees with plan.   Final Clinical Impressions(s) / UC Diagnoses   Final diagnoses:  Wrist pain, acute, right  Tendonitis of wrist, right     Discharge Instructions      Your xray showed some age related changes but nothing Stop by the pharmacy to pick up your prescriptions.  Follow up with your primary care provider as needed.  For your  pain, Take 1000 mg Tylenol  with Naprosyn  twice a day,  as needed for pain. Take prednisone  as prescribed. Rest and elevate the affected painful area.  As pain recedes, begin normal activities slowly as tolerated.  Follow up with primary care provider or an orthopedic provider, if symptoms persist.  Watch for worsening symptoms such as an increasing weakness or loss of sensation, increasing pain and/or the loss of bladder or bowel function. Should any of these occur, go to the emergency department  immediately.        ED Prescriptions     Medication Sig Dispense Auth. Provider   predniSONE  (STERAPRED UNI-PAK 21 TAB) 10 MG (21) TBPK tablet Take by mouth daily. Take 6 tabs by mouth daily for 1, then 5 tabs for 1 day, then 4 tabs for 1 day, then 3 tabs for 1 day, then 2 tabs for 1 day, then 1 tab for 1 day. 21 tablet Lilla Callejo, DO   naproxen  (NAPROSYN ) 500 MG tablet Take 1 tablet (500 mg total) by mouth 2 (two) times daily with a meal. 30 tablet Reha Martinovich, DO      PDMP not reviewed this encounter.   Kriste Berth, DO 06/29/23 1728

## 2023-06-23 NOTE — Discharge Instructions (Addendum)
 Your xray showed some age related changes but nothing Stop by the pharmacy to pick up your prescriptions.  Follow up with your primary care provider as needed.  For your  pain, Take 1000 mg Tylenol  with Naprosyn  twice a day,  as needed for pain. Take prednisone  as prescribed. Rest and elevate the affected painful area.  As pain recedes, begin normal activities slowly as tolerated.  Follow up with primary care provider or an orthopedic provider, if symptoms persist.  Watch for worsening symptoms such as an increasing weakness or loss of sensation, increasing pain and/or the loss of bladder or bowel function. Should any of these occur, go to the emergency department immediately.

## 2023-06-23 NOTE — ED Triage Notes (Signed)
 Pt c/o right wrist pain x3days  Pt states that she woke up with wrist pain  Pt states that she hurt her wrist 1 year ago by carrying 1 gallon of tea and milk and felt pain in her right wrist with swelling  Pt is wearing a wrist brace.  Pt states that she slept on her wrist wrong a couple of days ago and the pain has not one away.  Pt has been using an OTC pain patch.  Pt is grabbing her wrist and denies any pain, but states that she does not want to try turning her wrist.  Pt is worried about carpel tunnel.

## 2024-05-02 ENCOUNTER — Other Ambulatory Visit: Payer: Self-pay

## 2024-05-02 DIAGNOSIS — Z1231 Encounter for screening mammogram for malignant neoplasm of breast: Secondary | ICD-10-CM
